# Patient Record
Sex: Male | Born: 1965 | Race: Black or African American | Hispanic: No | Marital: Single | Smoking: Never smoker
Health system: Southern US, Community
[De-identification: ages and names within clinical notes are randomized; demographics above are authoritative.]

## PROBLEM LIST (undated history)

## (undated) DIAGNOSIS — I10 Essential (primary) hypertension: Secondary | ICD-10-CM

## (undated) DIAGNOSIS — G4730 Sleep apnea, unspecified: Secondary | ICD-10-CM

## (undated) HISTORY — PX: ADENOIDECTOMY: SUR15

## (undated) HISTORY — PX: EYE SURGERY: SHX253

## (undated) HISTORY — DX: Morbid (severe) obesity due to excess calories: E66.01

---

## 1998-10-17 ENCOUNTER — Emergency Department (HOSPITAL_COMMUNITY): Admission: EM | Admit: 1998-10-17 | Discharge: 1998-10-17 | Payer: Self-pay | Admitting: Emergency Medicine

## 1998-10-17 ENCOUNTER — Encounter: Payer: Self-pay | Admitting: Emergency Medicine

## 1998-11-05 ENCOUNTER — Emergency Department (HOSPITAL_COMMUNITY): Admission: EM | Admit: 1998-11-05 | Discharge: 1998-11-05 | Payer: Self-pay | Admitting: Emergency Medicine

## 1999-10-20 ENCOUNTER — Encounter (INDEPENDENT_AMBULATORY_CARE_PROVIDER_SITE_OTHER): Payer: Self-pay

## 1999-10-20 ENCOUNTER — Other Ambulatory Visit: Admission: RE | Admit: 1999-10-20 | Discharge: 1999-10-20 | Payer: Self-pay | Admitting: Otolaryngology

## 2002-10-05 ENCOUNTER — Emergency Department (HOSPITAL_COMMUNITY): Admission: EM | Admit: 2002-10-05 | Discharge: 2002-10-06 | Payer: Self-pay | Admitting: Emergency Medicine

## 2005-12-15 ENCOUNTER — Ambulatory Visit: Payer: Self-pay | Admitting: Family Medicine

## 2006-06-09 ENCOUNTER — Ambulatory Visit: Payer: Self-pay | Admitting: Family Medicine

## 2006-06-16 ENCOUNTER — Encounter: Payer: Self-pay | Admitting: Family Medicine

## 2006-06-16 LAB — CONVERTED CEMR LAB
Cholesterol: 190 mg/dL (ref 0–200)
HDL: 38 mg/dL — ABNORMAL LOW (ref >39)
LDL Cholesterol: 131 mg/dL — ABNORMAL HIGH (ref 0–99)
Total CHOL/HDL Ratio: 5
Triglycerides: 105 mg/dL (ref <150)
VLDL: 21 mg/dL (ref 0–40)

## 2006-09-09 ENCOUNTER — Ambulatory Visit: Payer: Self-pay | Admitting: Family Medicine

## 2006-12-23 ENCOUNTER — Ambulatory Visit: Payer: Self-pay | Admitting: Family Medicine

## 2006-12-28 ENCOUNTER — Encounter: Payer: Self-pay | Admitting: Family Medicine

## 2006-12-28 LAB — CONVERTED CEMR LAB
BUN: 11 mg/dL (ref 6–23)
CO2: 22 meq/L (ref 19–32)
Calcium: 9.6 mg/dL (ref 8.4–10.5)
Chloride: 106 meq/L (ref 96–112)
Cholesterol: 176 mg/dL (ref 0–200)
Creatinine, Ser: 0.98 mg/dL (ref 0.40–1.50)
Glucose, Bld: 89 mg/dL (ref 70–99)
HDL: 45 mg/dL (ref >39)
LDL Cholesterol: 112 mg/dL — ABNORMAL HIGH (ref 0–99)
Potassium: 4.8 meq/L (ref 3.5–5.3)
Sodium: 143 meq/L (ref 135–145)
Total CHOL/HDL Ratio: 3.9
Triglycerides: 93 mg/dL (ref <150)
VLDL: 19 mg/dL (ref 0–40)

## 2007-02-09 ENCOUNTER — Encounter: Payer: Self-pay | Admitting: Family Medicine

## 2007-02-21 ENCOUNTER — Emergency Department (HOSPITAL_COMMUNITY): Admission: EM | Admit: 2007-02-21 | Discharge: 2007-02-22 | Payer: Self-pay | Admitting: Emergency Medicine

## 2007-05-05 ENCOUNTER — Ambulatory Visit: Payer: Self-pay | Admitting: Family Medicine

## 2007-05-12 DIAGNOSIS — M79609 Pain in unspecified limb: Secondary | ICD-10-CM

## 2007-05-12 DIAGNOSIS — E785 Hyperlipidemia, unspecified: Secondary | ICD-10-CM

## 2007-05-12 DIAGNOSIS — E669 Obesity, unspecified: Secondary | ICD-10-CM | POA: Insufficient documentation

## 2007-05-22 ENCOUNTER — Ambulatory Visit: Payer: Self-pay | Admitting: Family Medicine

## 2009-01-08 ENCOUNTER — Emergency Department (HOSPITAL_COMMUNITY): Admission: EM | Admit: 2009-01-08 | Discharge: 2009-01-08 | Payer: Self-pay | Admitting: Family Medicine

## 2010-03-10 NOTE — Letter (Signed)
Summary: rpc chart  rpc chart   Imported By: Curtis Sites 08/01/2009 08:15:57  _____________________________________________________________________  External Attachment:    Type:   Image     Comment:   External Document

## 2010-08-05 ENCOUNTER — Ambulatory Visit: Payer: 59 | Attending: Family Medicine | Admitting: Physical Therapy

## 2010-08-05 DIAGNOSIS — IMO0001 Reserved for inherently not codable concepts without codable children: Secondary | ICD-10-CM | POA: Insufficient documentation

## 2010-08-05 DIAGNOSIS — R262 Difficulty in walking, not elsewhere classified: Secondary | ICD-10-CM | POA: Insufficient documentation

## 2010-08-10 ENCOUNTER — Ambulatory Visit: Payer: 59 | Attending: Family Medicine | Admitting: Physical Therapy

## 2010-08-10 DIAGNOSIS — IMO0001 Reserved for inherently not codable concepts without codable children: Secondary | ICD-10-CM | POA: Insufficient documentation

## 2010-08-10 DIAGNOSIS — R262 Difficulty in walking, not elsewhere classified: Secondary | ICD-10-CM | POA: Insufficient documentation

## 2010-08-24 ENCOUNTER — Encounter: Payer: 59 | Admitting: Physical Therapy

## 2010-08-26 ENCOUNTER — Encounter: Payer: 59 | Admitting: Physical Therapy

## 2010-09-01 ENCOUNTER — Encounter: Payer: 59 | Admitting: Physical Therapy

## 2010-09-03 ENCOUNTER — Encounter: Payer: 59 | Admitting: Physical Therapy

## 2010-11-04 ENCOUNTER — Ambulatory Visit (INDEPENDENT_AMBULATORY_CARE_PROVIDER_SITE_OTHER): Payer: 59

## 2010-11-04 ENCOUNTER — Inpatient Hospital Stay (INDEPENDENT_AMBULATORY_CARE_PROVIDER_SITE_OTHER)
Admission: RE | Admit: 2010-11-04 | Discharge: 2010-11-04 | Disposition: A | Discharge status: Home / Self Care | Payer: 59 | Source: Ambulatory Visit | Attending: Emergency Medicine | Admitting: Emergency Medicine

## 2010-11-04 DIAGNOSIS — S61209A Unspecified open wound of unspecified finger without damage to nail, initial encounter: Secondary | ICD-10-CM

## 2011-02-17 ENCOUNTER — Ambulatory Visit (INDEPENDENT_AMBULATORY_CARE_PROVIDER_SITE_OTHER): Payer: 59 | Admitting: General Surgery

## 2011-02-19 ENCOUNTER — Encounter (INDEPENDENT_AMBULATORY_CARE_PROVIDER_SITE_OTHER): Payer: Self-pay | Admitting: General Surgery

## 2011-02-19 ENCOUNTER — Ambulatory Visit (INDEPENDENT_AMBULATORY_CARE_PROVIDER_SITE_OTHER): Payer: 59 | Admitting: General Surgery

## 2011-02-19 NOTE — Progress Notes (Signed)
Patient ID: Jay Gonzalez, male   DOB: 02-16-1965, 46 y.o.   MRN: [id number]  Chief Complaint  Patient presents with  . Other    Lap Band initial    [name] is a 46 y.o. male.   HPI In this patient presents for initial weight loss surgery evaluation. Has a BMI of 45 with obstructive sleep apnea and variable blood pressure. He has attended our information seminar and he is interested in the LAP-BAND. He knows several people who have had lab and and he feels that this will be the best procedure for him to give him "a jump start" with his weight loss. He has been researching also the sleeve gastrectomy but feels that the band will be the safest alternative for him. He has tried several diets including Atkins diet and Weight Watchers but has always regained the weight. He states that he is interested in the weight loss but does not want to lose weight to fast. His goal rate would be around 200-220 pounds. He is riding his bicycle and doing exercises well to try to compensate. He denies any reflux symptoms.  No past medical history on file. PMH: Sleep apnea Variable BP  Past Surgical History  Procedure Date  . Adenoidectomy   . Eye surgery 1971, 1981   Eye surgery Adenoids  No family history on file.  Social History History  Substance Use Topics  . Smoking status: Not on file  . Smokeless tobacco: Not on file  . Alcohol Use: Not on file  Denies smoking Drinks 3-4 drinks/week  Not on File  Current Outpatient Prescriptions  Medication Sig Dispense Refill  . Aspirin-Caffeine (BC FAST PAIN RELIEF PO) Take by mouth.        Review of Systems Review of Systems All other review of systems negative or noncontributory except as stated in the HPI  Blood pressure 142/88, pulse 68, temperature 97.5 F (36.4 C), temperature source Temporal, resp. rate 16, height 5\' 10"  (1.778 m), weight 321 lb (145.605 kg). BMI 45.9 Wt 321lbs  Physical Exam Physical Exam  Vitals  reviewed. Constitutional: He is oriented to person, place, and time. He appears well-developed and well-nourished. No distress.  HENT:  Head: Normocephalic and atraumatic.  Mouth/Throat: No oropharyngeal exudate.  Eyes: Conjunctivae are normal. Pupils are equal, round, and reactive to light. Right eye exhibits no discharge. Left eye exhibits no discharge. No scleral icterus.  Neck: Normal range of motion. No tracheal deviation present.  Cardiovascular: Normal rate, regular rhythm, normal heart sounds and intact distal pulses.   Pulmonary/Chest: Effort normal and breath sounds normal. No stridor. No respiratory distress. He has no wheezes. He has no rales. He exhibits no tenderness.  Abdominal: Soft. Bowel sounds are normal. He exhibits no distension and no mass. There is no tenderness. There is no rebound and no guarding.  Musculoskeletal: Normal range of motion. He exhibits no edema.  Neurological: He is alert and oriented to person, place, and time.  Skin: Skin is warm and dry. No rash noted. He is not diaphoretic. No erythema. No pallor.  Psychiatric: He has a normal mood and affect. His behavior is normal. Judgment and thought content normal.    Data Reviewed   Assessment    Morbid obesity with a BMI of 45.9 and comorbidities of obstructive sleep apnea and hypertension. I think that he be a fine candidate for any of the 3 weight loss procedures and we discussed the risks and benefits of each procedure.  He is most interested in the LAP-BAND but is also considering the sleeve gastrectomy. I think that he would be a fine candidate for the LAP-BAND if this is what he continues to desire. We did emphasize the necessary lifestyle changes that would be required for any of the procedures. The risks of the procedure and the alternatives of continued medical weight loss. We discussed the risks of malnutrition, vitamin deficiencies, 2 little or too much weight loss, weight regain, need for revisional  surgery, and the need for continued and ongoing followup, slippage, and erosion and express understanding and desires to proceed with Lap band evaluation.    Plan    We will get him started on the weight loss surgery workup including nutrition labs, upper GI, psychology evaluation and nutrition evaluation. After this was complete he should be ready for his procedure.       Lodema Pilot DAVID 02/19/2011, 9:15 AM

## 2011-02-20 LAB — CBC WITH DIFFERENTIAL/PLATELET
Basophils Absolute: 0 10*3/uL (ref 0.0–0.1)
Basophils Relative: 0 % (ref 0–1)
Eosinophils Absolute: 0.2 10*3/uL (ref 0.0–0.7)
Eosinophils Relative: 3 % (ref 0–5)
HCT: 45.1 % (ref 39.0–52.0)
Hemoglobin: 15.1 g/dL (ref 13.0–17.0)
Lymphocytes Relative: 25 % (ref 12–46)
Lymphs Abs: 1.7 10*3/uL (ref 0.7–4.0)
MCH: 29.5 pg (ref 26.0–34.0)
MCHC: 33.5 g/dL (ref 30.0–36.0)
MCV: 88.1 fL (ref 78.0–100.0)
Monocytes Absolute: 0.7 10*3/uL (ref 0.1–1.0)
Monocytes Relative: 10 % (ref 3–12)
Neutro Abs: 4.2 10*3/uL (ref 1.7–7.7)
Neutrophils Relative %: 61 % (ref 43–77)
Platelets: 219 10*3/uL (ref 150–400)
RBC: 5.12 MIL/uL (ref 4.22–5.81)
RDW: 14.6 % (ref 11.5–15.5)
WBC: 7 10*3/uL (ref 4.0–10.5)

## 2011-02-20 LAB — COMPREHENSIVE METABOLIC PANEL
ALT: 19 U/L (ref 0–53)
AST: 16 U/L (ref 0–37)
Calcium: 9.5 mg/dL (ref 8.4–10.5)
Chloride: 106 mEq/L (ref 96–112)
Creat: 0.85 mg/dL (ref 0.50–1.35)
Total Bilirubin: 0.9 mg/dL (ref 0.3–1.2)

## 2011-02-20 LAB — LIPID PANEL
HDL: 38 mg/dL — ABNORMAL LOW (ref >39)
LDL Cholesterol: 104 mg/dL — ABNORMAL HIGH (ref 0–99)
Total CHOL/HDL Ratio: 4.3 Ratio
Triglycerides: 100 mg/dL (ref <150)
VLDL: 20 mg/dL (ref 0–40)

## 2011-02-24 ENCOUNTER — Ambulatory Visit: Payer: 59 | Admitting: *Deleted

## 2011-03-01 ENCOUNTER — Ambulatory Visit (HOSPITAL_COMMUNITY)
Admission: RE | Admit: 2011-03-01 | Discharge: 2011-03-01 | Disposition: A | Discharge status: Home / Self Care | Payer: 59 | Source: Ambulatory Visit | Attending: General Surgery | Admitting: General Surgery

## 2011-03-01 ENCOUNTER — Other Ambulatory Visit: Payer: Self-pay

## 2011-03-01 DIAGNOSIS — Z01818 Encounter for other preprocedural examination: Secondary | ICD-10-CM | POA: Insufficient documentation

## 2011-03-01 DIAGNOSIS — Z01810 Encounter for preprocedural cardiovascular examination: Secondary | ICD-10-CM | POA: Insufficient documentation

## 2011-03-04 ENCOUNTER — Other Ambulatory Visit (INDEPENDENT_AMBULATORY_CARE_PROVIDER_SITE_OTHER): Payer: Self-pay | Admitting: General Surgery

## 2011-03-17 ENCOUNTER — Ambulatory Visit: Payer: 59 | Admitting: *Deleted

## 2011-04-05 ENCOUNTER — Ambulatory Visit: Payer: 59 | Admitting: *Deleted

## 2011-04-07 ENCOUNTER — Ambulatory Visit: Payer: 59 | Admitting: *Deleted

## 2011-04-12 ENCOUNTER — Encounter: Payer: Self-pay | Admitting: *Deleted

## 2011-05-20 ENCOUNTER — Encounter: Payer: Self-pay | Admitting: *Deleted

## 2011-05-20 ENCOUNTER — Encounter: Payer: 59 | Attending: General Surgery | Admitting: *Deleted

## 2011-05-20 VITALS — Ht 70.0 in | Wt 310.0 lb

## 2011-05-20 DIAGNOSIS — E669 Obesity, unspecified: Secondary | ICD-10-CM

## 2011-05-20 DIAGNOSIS — Z01818 Encounter for other preprocedural examination: Secondary | ICD-10-CM | POA: Insufficient documentation

## 2011-05-20 DIAGNOSIS — Z713 Dietary counseling and surveillance: Secondary | ICD-10-CM | POA: Insufficient documentation

## 2011-05-20 NOTE — Patient Instructions (Signed)
   Follow Pre-Op Nutrition Goals to prepare for Lapband Surgery.   Call the [facility] at [phone] once you have been given your surgery date to enrolled in the Pre-Op Nutrition Class. You will need to attend this nutrition class 3-4 weeks prior to your surgery. 

## 2011-05-20 NOTE — Progress Notes (Signed)
  Pre-Op Assessment Visit:  Pre-Operative LAGB Surgery  Medical Nutrition Therapy:  Appt start time: 1200 end time:  1300.  Patient was seen on 05/20/2011 for Pre-Operative LAGB Nutrition Assessment. Assessment and letter of approval faxed to [facility] Surgery Bariatric Surgery Program coordinator on 05/21/11.  Approval letter sent to [facility] center and will be available in the chart under the media tab.  TANITA  BODY COMP RESULTS  05/20/11     %Fat 42.1%     FM (lbs) 130.5     FFM (lbs) 179.5     TBW (lbs) 131.5      Handouts given during visit include:  Pre-Op Goals   Bariatric Support Group Calendar  B.E.L.T. Program Flyer  Patient to call for Pre-Op and Post-Op Nutrition Education at the [facility] when surgery is scheduled.

## 2011-06-23 ENCOUNTER — Other Ambulatory Visit (INDEPENDENT_AMBULATORY_CARE_PROVIDER_SITE_OTHER): Payer: Self-pay | Admitting: General Surgery

## 2011-06-23 DIAGNOSIS — E669 Obesity, unspecified: Secondary | ICD-10-CM

## 2011-08-19 ENCOUNTER — Ambulatory Visit (INDEPENDENT_AMBULATORY_CARE_PROVIDER_SITE_OTHER): Payer: 59 | Admitting: General Surgery

## 2011-09-09 ENCOUNTER — Encounter (INDEPENDENT_AMBULATORY_CARE_PROVIDER_SITE_OTHER): Payer: 59 | Admitting: General Surgery

## 2011-09-23 ENCOUNTER — Encounter: Payer: Self-pay | Admitting: *Deleted

## 2011-09-23 ENCOUNTER — Encounter: Payer: 59 | Attending: General Surgery | Admitting: *Deleted

## 2011-09-23 VITALS — Ht 70.0 in | Wt 306.0 lb

## 2011-09-23 DIAGNOSIS — Z01818 Encounter for other preprocedural examination: Secondary | ICD-10-CM | POA: Insufficient documentation

## 2011-09-23 DIAGNOSIS — Z713 Dietary counseling and surveillance: Secondary | ICD-10-CM | POA: Insufficient documentation

## 2011-09-23 DIAGNOSIS — E669 Obesity, unspecified: Secondary | ICD-10-CM | POA: Insufficient documentation

## 2011-09-23 NOTE — Progress Notes (Signed)
Bariatric Class:  Appt start time: 0830 end time:  0930.  Pre-Operative Nutrition Class  Patient was seen on 09/23/2011 for Pre-Operative Bariatric Surgery Education at the [facility].   Surgery date: 10/19/11 Surgery type: LAGB Start weight at [facility]: 310.0 lbs  Weight today: 306.0 lbs Weight change: 4.0 lbs Total weight lost: 4.0 lbs BMI: 43.9 kg/m^2  TANITA  BODY COMP RESULTS  05/20/11 09/23/11   %Fat 42.1% 40.9%   Fat Mass (lbs) 130.5 125.0   Fat Free Mass (lbs) 179.5 181.0   Total Body Water (lbs) 131.5 132.5   Samples given per MNT protocol: Bariatric Advantage Multivitamin Lot # [id number] Exp:12/13  Celebrate Vitamins Multivitamin Lot # [id number] Exp: 07/14  Celebrate Vitamins Multivitamin Complete Lot # [id number] Exp: 11/14  Celebrate Vitamins Calcium Citrate Lot # [id number] Exp: 10/14  Celebrate Vitamins Sublingual B-12 Lot # [id number] Exp: 05/15  Corliss Marcus Protein Powder Lot # [id number] Exp: 12/14  The following the learning objective met by the patient during this course:  Identifies Pre-Op Dietary Goals and will begin 2 weeks pre-operatively  Identifies appropriate sources of fluids and proteins   States protein recommendations and appropriate sources pre and post-operatively  Identifies Post-Operative Dietary Goals and will follow for 2 weeks post-operatively  Identifies appropriate multivitamin and calcium sources  Describes the need for physical activity post-operatively and will follow MD recommendations  States when to call healthcare provider regarding medication questions or post-operative complications  Handouts given during class include:  Pre-Op Bariatric Surgery Diet Handout  Protein Shake Handout  Post-Op Bariatric Surgery Nutrition Handout  BELT Program Information Flyer  Support Group Information Flyer  Follow-Up Plan: Patient will follow-up at [facility] 2 weeks post operatively for diet advancement per MD.

## 2011-09-23 NOTE — Patient Instructions (Signed)
Follow:   Pre-Op Diet per MD 2 weeks prior to surgery  Phase 2- Liquids (clear/full) 2 weeks after surgery  Vitamin/Mineral/Calcium guidelines for purchasing bariatric supplements  Exercise guidelines pre and post-op per MD  Follow-up at [facility] in 2 weeks post-op for diet advancement. Contact Salah Burlison as needed with questions/concerns. 

## 2011-10-05 ENCOUNTER — Encounter (HOSPITAL_COMMUNITY): Payer: Self-pay | Admitting: Pharmacy Technician

## 2011-10-05 NOTE — Progress Notes (Signed)
10-05-11 1045 am -Dr. Biagio Quint -pt. Needs MD order entry in EPIC for PST visit 09-11-11 1415PM [facility]. W. Kennon Portela

## 2011-10-07 ENCOUNTER — Telehealth (INDEPENDENT_AMBULATORY_CARE_PROVIDER_SITE_OTHER): Payer: Self-pay

## 2011-10-07 NOTE — Telephone Encounter (Signed)
Epic Orders needed

## 2011-10-07 NOTE — Telephone Encounter (Signed)
Jay Gonzalez called needing pre admit orders put in on this patient. His appointment is 8/30 @ 1:00. I told her He will be in office in the morning and would send him a message.

## 2011-10-08 ENCOUNTER — Encounter (INDEPENDENT_AMBULATORY_CARE_PROVIDER_SITE_OTHER): Payer: Self-pay | Admitting: General Surgery

## 2011-10-08 ENCOUNTER — Encounter (HOSPITAL_COMMUNITY)
Admission: RE | Admit: 2011-10-08 | Discharge: 2011-10-08 | Disposition: A | Discharge status: Home / Self Care | Payer: 59 | Source: Ambulatory Visit | Attending: General Surgery | Admitting: General Surgery

## 2011-10-08 ENCOUNTER — Ambulatory Visit (INDEPENDENT_AMBULATORY_CARE_PROVIDER_SITE_OTHER): Payer: 59 | Admitting: General Surgery

## 2011-10-08 ENCOUNTER — Encounter (HOSPITAL_COMMUNITY): Payer: Self-pay

## 2011-10-08 LAB — CBC WITH DIFFERENTIAL/PLATELET
Basophils Absolute: 0 10*3/uL (ref 0.0–0.1)
Basophils Relative: 1 % (ref 0–1)
Eosinophils Absolute: 0.2 10*3/uL (ref 0.0–0.7)
Eosinophils Relative: 3 % (ref 0–5)
HCT: 44.3 % (ref 39.0–52.0)
Hemoglobin: 15.7 g/dL (ref 13.0–17.0)
MCH: 29.5 pg (ref 26.0–34.0)
MCHC: 35.4 g/dL (ref 30.0–36.0)
Monocytes Absolute: 0.6 10*3/uL (ref 0.1–1.0)
Monocytes Relative: 8 % (ref 3–12)
Neutro Abs: 4.7 10*3/uL (ref 1.7–7.7)
RDW: 13.6 % (ref 11.5–15.5)

## 2011-10-08 LAB — SURGICAL PCR SCREEN
MRSA, PCR: NEGATIVE
Staphylococcus aureus: POSITIVE — AB

## 2011-10-08 LAB — COMPREHENSIVE METABOLIC PANEL
AST: 21 U/L (ref 0–37)
Albumin: 3.9 g/dL (ref 3.5–5.2)
BUN: 13 mg/dL (ref 6–23)
Calcium: 9.4 mg/dL (ref 8.4–10.5)
Chloride: 103 mEq/L (ref 96–112)
Creatinine, Ser: 0.93 mg/dL (ref 0.50–1.35)
Total Bilirubin: 0.7 mg/dL (ref 0.3–1.2)
Total Protein: 7.4 g/dL (ref 6.0–8.3)

## 2011-10-08 NOTE — Progress Notes (Signed)
Patient ID: COAST FRANCIA, male   DOB: 1965-05-07, 46 y.o.   MRN: [id number]  Chief Complaint  Patient presents with  . Bariatric Pre-op    [name] is a 46 y.o. male.  This patient comes in today for his preoperative evaluation for lap band surgery on September 10 2 he has already started his preoperative diet and has lost 67 pounds already. He has a BMI of 43 with sleep apnea and is using his CPAP machine. He is exercising appropriately biking twice per week and plans on increasing this to 4-5 times per week if possible. He has already completed his nutrition and psychology evaluation and laboratory studies. HPI  Past Medical History  Diagnosis Date  . Morbid obesity     Past Surgical History  Procedure Date  . Adenoidectomy   . Eye surgery 1971, 16    Family History  Problem Relation Age of Onset  . Cancer Paternal Aunt   . Cancer Paternal Aunt   . Cancer Paternal Aunt     Social History History  Substance Use Topics  . Smoking status: Never Smoker   . Smokeless tobacco: Not on file  . Alcohol Use: Yes     maybe 2 beers per week or less    No Known Allergies  No current outpatient prescriptions on file.    Review of Systems Review of Systems All other review of systems negative or noncontributory except as stated in the HPI  Blood pressure 144/72, pulse 70, temperature 97.8 F (36.6 C), temperature source Temporal, resp. rate 16, height 5\' 10"  (1.778 m), weight 303 lb (137.44 kg).  Physical Exam Physical Exam Physical Exam  Vitals reviewed. Constitutional: He is oriented to person, place, and time. He appears well-developed and well-nourished. No distress.  HENT:  Head: Normocephalic and atraumatic.  Mouth/Throat: No oropharyngeal exudate.  Eyes: Conjunctivae and EOM are normal. Pupils are equal, round, and reactive to light. Right eye exhibits no discharge. Left eye exhibits no discharge. No scleral icterus.  Neck: Normal range of motion. No  tracheal deviation present.  Cardiovascular: Normal rate, regular rhythm and normal heart sounds.   Pulmonary/Chest: Effort normal and breath sounds normal. No stridor. No respiratory distress. He has no wheezes. He has no rales. He exhibits no tenderness.  Abdominal: Soft. Bowel sounds are normal. He exhibits no distension and no mass. There is no tenderness. There is no rebound and no guarding.  Musculoskeletal: Normal range of motion. He exhibits no edema and no tenderness.  Neurological: He is alert and oriented to person, place, and time.  Skin: Skin is warm and dry. No rash noted. He is not diaphoretic. No erythema. No pallor.  Psychiatric: He has a normal mood and affect. His behavior is normal. Judgment and thought content normal.    Data Reviewed   Assessment    Morbid obesity with a BMI of 43 and comorbidities of obstructive sleep apnea. He appears to be ready and excited about his upcoming LAP-BAND surgery. He is scheduled for September 10. We again discussed the procedure as well as its benefits and alternatives. We discussed the risks of the procedure The risks of infection, bleeding, pain, scarring, weight regain, too little or too much weight loss, vitamin deficiencies and need for lifelong vitamin supplementation, hair loss, need for protein supplementation, food intolerance, need for reoperation, need for open surgery, band slippage, port malfunction, esophageal dilation and swallowing problems, injury to spleen or surrounding structures, DVT's, PE, and death again  discussed with the patient and the patient expressed understanding and desires to proceed with laparoscopic adjustable gastric band.     Plan    Will plan for lap band placement next week.       Lodema Pilot DAVID 10/08/2011, 1:52 PM

## 2011-10-08 NOTE — Patient Instructions (Addendum)
20 GALO SAYED  10/08/2011   Your procedure is scheduled on:  Tuesday 10/19/2011 at 1035 am  Report to [facility] at 0800 AM.  Call this number if you have problems the morning of surgery: [phone]   Remember:   Do not eat food:After Midnight.  May have clear liquids:until Midnight .    Take these medicines the morning of surgery with A SIP OF WATER: NONE   Do not wear jewelry  Do not wear lotions, powders, or perfumes.  Do not shave 48 hours prior to surgery. Men may shave face and neck.  Do not bring valuables to the hospital.  Contacts, dentures or bridgework may not be worn into surgery.  Leave suitcase in the car. After surgery it may be brought to your room.  For patients admitted to the hospital, checkout time is 11:00 AM the day of discharge.       Special Instructions: CHG Shower Use Special Wash: 1/2 bottle night before surgery and 1/2 bottle morning of surgery.   Please read over the following fact sheets that you were given: MRSA Information, Sleep Apnea sheet, Incentive Spirometry sheet                 Make sure if you are going home the same day as your surgery that you have sometone here to drive you home and someone to stay with you overnight after surgery.                If you have any questions, please call me at [phone] [facility]. Georgeanna Lea, RN,BSN

## 2011-10-08 NOTE — Progress Notes (Signed)
Office note from 04/26/2011 from Dr. Theressa Millard, and Sleep apnea study from [facility] services dated 03/25/2010, and 04/15/2010 all on chart.

## 2011-10-19 ENCOUNTER — Encounter (HOSPITAL_COMMUNITY)
Admission: RE | Disposition: A | Discharge status: Home / Self Care | Payer: Self-pay | Source: Ambulatory Visit | Attending: General Surgery

## 2011-10-19 ENCOUNTER — Encounter (HOSPITAL_COMMUNITY): Payer: Self-pay | Admitting: Certified Registered Nurse Anesthetist

## 2011-10-19 ENCOUNTER — Ambulatory Visit (HOSPITAL_COMMUNITY): Payer: 59 | Admitting: Certified Registered Nurse Anesthetist

## 2011-10-19 ENCOUNTER — Encounter (HOSPITAL_COMMUNITY): Payer: Self-pay | Admitting: *Deleted

## 2011-10-19 ENCOUNTER — Ambulatory Visit (HOSPITAL_COMMUNITY)
Admission: RE | Admit: 2011-10-19 | Discharge: 2011-10-20 | Disposition: A | Discharge status: Home / Self Care | Payer: 59 | Source: Ambulatory Visit | Attending: General Surgery | Admitting: General Surgery

## 2011-10-19 DIAGNOSIS — G4733 Obstructive sleep apnea (adult) (pediatric): Secondary | ICD-10-CM

## 2011-10-19 DIAGNOSIS — Z01812 Encounter for preprocedural laboratory examination: Secondary | ICD-10-CM | POA: Insufficient documentation

## 2011-10-19 DIAGNOSIS — Z6841 Body Mass Index (BMI) 40.0 and over, adult: Secondary | ICD-10-CM

## 2011-10-19 HISTORY — PX: LAPAROSCOPIC GASTRIC BANDING: SHX1100

## 2011-10-19 SURGERY — GASTRIC BANDING, LAPAROSCOPIC
Anesthesia: General | Site: Abdomen | Wound class: Clean

## 2011-10-19 MED ORDER — ENOXAPARIN SODIUM 40 MG/0.4ML ~~LOC~~ SOLN
40.0000 mg | SUBCUTANEOUS | Status: DC
  Administered 2011-10-20: 40 mg via SUBCUTANEOUS
  Filled 2011-10-19 (×2): qty 0.4

## 2011-10-19 MED ORDER — KCL IN DEXTROSE-NACL 20-5-0.45 MEQ/L-%-% IV SOLN
INTRAVENOUS | Status: AC
  Administered 2011-10-19: 15:00:00
  Filled 2011-10-19: qty 1000

## 2011-10-19 MED ORDER — 0.9 % SODIUM CHLORIDE (POUR BTL) OPTIME
TOPICAL | Status: DC | PRN
  Administered 2011-10-19: 1000 mL

## 2011-10-19 MED ORDER — FENTANYL CITRATE 0.05 MG/ML IJ SOLN
INTRAMUSCULAR | Status: DC | PRN
  Administered 2011-10-19 (×5): 50 ug via INTRAVENOUS

## 2011-10-19 MED ORDER — LACTATED RINGERS IV SOLN
INTRAVENOUS | Status: DC
  Administered 2011-10-19: 1000 mL via INTRAVENOUS

## 2011-10-19 MED ORDER — DEXAMETHASONE SODIUM PHOSPHATE 10 MG/ML IJ SOLN
INTRAMUSCULAR | Status: DC | PRN
  Administered 2011-10-19: 10 mg via INTRAVENOUS

## 2011-10-19 MED ORDER — ROCURONIUM BROMIDE 100 MG/10ML IV SOLN
INTRAVENOUS | Status: DC | PRN
  Administered 2011-10-19 (×2): 10 mg via INTRAVENOUS
  Administered 2011-10-19: 40 mg via INTRAVENOUS

## 2011-10-19 MED ORDER — CEFAZOLIN SODIUM-DEXTROSE 2-3 GM-% IV SOLR
INTRAVENOUS | Status: AC
  Filled 2011-10-19: qty 50

## 2011-10-19 MED ORDER — BUPIVACAINE-EPINEPHRINE 0.25% -1:200000 IJ SOLN
INTRAMUSCULAR | Status: DC | PRN
  Administered 2011-10-19: 15 mL

## 2011-10-19 MED ORDER — PROPOFOL 10 MG/ML IV BOLUS
INTRAVENOUS | Status: DC | PRN
  Administered 2011-10-19: 200 mg via INTRAVENOUS

## 2011-10-19 MED ORDER — ONDANSETRON HCL 4 MG/2ML IJ SOLN
INTRAMUSCULAR | Status: DC | PRN
  Administered 2011-10-19: 4 mg via INTRAVENOUS

## 2011-10-19 MED ORDER — KCL IN DEXTROSE-NACL 20-5-0.45 MEQ/L-%-% IV SOLN
INTRAVENOUS | Status: DC
  Administered 2011-10-19 – 2011-10-20 (×3): via INTRAVENOUS
  Filled 2011-10-19 (×5): qty 1000

## 2011-10-19 MED ORDER — ACETAMINOPHEN 160 MG/5ML PO SOLN: 650.0000 mg | ORAL | Status: DC | PRN

## 2011-10-19 MED ORDER — MORPHINE SULFATE 2 MG/ML IJ SOLN
2.0000 mg | INTRAMUSCULAR | Status: DC | PRN
  Administered 2011-10-19 (×4): 2 mg via INTRAVENOUS
  Administered 2011-10-20: 4 mg via INTRAVENOUS
  Administered 2011-10-20: 2 mg via INTRAVENOUS
  Filled 2011-10-19 (×2): qty 1
  Filled 2011-10-19: qty 2
  Filled 2011-10-19 (×3): qty 1

## 2011-10-19 MED ORDER — ACETAMINOPHEN 10 MG/ML IV SOLN
INTRAVENOUS | Status: DC | PRN
  Administered 2011-10-19: 1000 mg via INTRAVENOUS

## 2011-10-19 MED ORDER — ONDANSETRON HCL 4 MG/2ML IJ SOLN
4.0000 mg | INTRAMUSCULAR | Status: DC | PRN
  Administered 2011-10-19 (×2): 4 mg via INTRAVENOUS
  Filled 2011-10-19 (×2): qty 2

## 2011-10-19 MED ORDER — ENOXAPARIN SODIUM 40 MG/0.4ML ~~LOC~~ SOLN
40.0000 mg | Freq: Once | SUBCUTANEOUS | Status: AC
  Administered 2011-10-19: 40 mg via SUBCUTANEOUS
  Filled 2011-10-19: qty 0.4

## 2011-10-19 MED ORDER — SUCCINYLCHOLINE CHLORIDE 20 MG/ML IJ SOLN
INTRAMUSCULAR | Status: DC | PRN
  Administered 2011-10-19: 100 mg via INTRAVENOUS

## 2011-10-19 MED ORDER — LIDOCAINE-EPINEPHRINE 1 %-1:100000 IJ SOLN
INTRAMUSCULAR | Status: AC
  Filled 2011-10-19: qty 1

## 2011-10-19 MED ORDER — ACETAMINOPHEN 10 MG/ML IV SOLN
INTRAVENOUS | Status: AC
  Filled 2011-10-19: qty 100

## 2011-10-19 MED ORDER — LACTATED RINGERS IV SOLN: INTRAVENOUS | Status: DC

## 2011-10-19 MED ORDER — BUPIVACAINE HCL (PF) 0.25 % IJ SOLN
INTRAMUSCULAR | Status: AC
  Filled 2011-10-19: qty 30

## 2011-10-19 MED ORDER — NEOSTIGMINE METHYLSULFATE 1 MG/ML IJ SOLN
INTRAMUSCULAR | Status: DC | PRN
  Administered 2011-10-19: 5 mg via INTRAVENOUS

## 2011-10-19 MED ORDER — MEPERIDINE HCL 50 MG/ML IJ SOLN: 6.2500 mg | INTRAMUSCULAR | Status: DC | PRN

## 2011-10-19 MED ORDER — LIDOCAINE-EPINEPHRINE 1 %-1:100000 IJ SOLN
INTRAMUSCULAR | Status: DC | PRN
  Administered 2011-10-19: 15 mL

## 2011-10-19 MED ORDER — HYDROMORPHONE HCL PF 1 MG/ML IJ SOLN: 0.2500 mg | INTRAMUSCULAR | Status: DC | PRN

## 2011-10-19 MED ORDER — MIDAZOLAM HCL 5 MG/5ML IJ SOLN
INTRAMUSCULAR | Status: DC | PRN
  Administered 2011-10-19: 2 mg via INTRAVENOUS

## 2011-10-19 MED ORDER — DEXTROSE 5 % IV SOLN
3.0000 g | INTRAVENOUS | Status: AC
  Administered 2011-10-19: 3 g via INTRAVENOUS
  Filled 2011-10-19: qty 3000

## 2011-10-19 MED ORDER — OXYCODONE-ACETAMINOPHEN 5-325 MG/5ML PO SOLN
5.0000 mL | ORAL | Status: DC | PRN
  Administered 2011-10-20: 10 mL via ORAL
  Filled 2011-10-19: qty 10

## 2011-10-19 MED ORDER — PROMETHAZINE HCL 25 MG/ML IJ SOLN: 6.2500 mg | INTRAMUSCULAR | Status: DC | PRN

## 2011-10-19 MED ORDER — SODIUM CHLORIDE 0.9 % IJ SOLN
INTRAMUSCULAR | Status: DC | PRN
  Administered 2011-10-19: 20 mL

## 2011-10-19 MED ORDER — CEFAZOLIN SODIUM 1-5 GM-% IV SOLN
INTRAVENOUS | Status: AC
  Filled 2011-10-19: qty 50

## 2011-10-19 MED ORDER — LIDOCAINE HCL (CARDIAC) 20 MG/ML IV SOLN
INTRAVENOUS | Status: DC | PRN
  Administered 2011-10-19: 100 mg via INTRAVENOUS

## 2011-10-19 MED ORDER — GLYCOPYRROLATE 0.2 MG/ML IJ SOLN
INTRAMUSCULAR | Status: DC | PRN
  Administered 2011-10-19: 0.6 mg via INTRAVENOUS

## 2011-10-19 SURGICAL SUPPLY — 60 items
ADH SKN CLS APL DERMABOND .7 (GAUZE/BANDAGES/DRESSINGS)
APL SKNCLS STERI-STRIP NONHPOA (GAUZE/BANDAGES/DRESSINGS)
BAND LAP VG SYSTEM W/TUBES (Band) IMPLANT
BENZOIN TINCTURE PRP APPL 2/3 (GAUZE/BANDAGES/DRESSINGS) IMPLANT
BLADE HEX COATED 2.75 (ELECTRODE) ×3 IMPLANT
BLADE SURG 15 STRL LF DISP TIS (BLADE) ×2 IMPLANT
BLADE SURG 15 STRL SS (BLADE) ×3
BLADE SURG SZ11 CARB STEEL (BLADE) ×3 IMPLANT
CABLE HIGH FREQUENCY MONO STRZ (ELECTRODE) ×3 IMPLANT
CANISTER SUCTION 2500CC (MISCELLANEOUS) ×3 IMPLANT
CHLORAPREP W/TINT 26ML (MISCELLANEOUS) ×6 IMPLANT
CLOTH BEACON ORANGE TIMEOUT ST (SAFETY) ×3 IMPLANT
COVER SURGICAL LIGHT HANDLE (MISCELLANEOUS) ×3 IMPLANT
DECANTER SPIKE VIAL GLASS SM (MISCELLANEOUS) ×6 IMPLANT
DERMABOND ADVANCED (GAUZE/BANDAGES/DRESSINGS)
DERMABOND ADVANCED .7 DNX12 (GAUZE/BANDAGES/DRESSINGS) IMPLANT
DEVICE SUTURE ENDOST 10MM (ENDOMECHANICALS) ×3 IMPLANT
DRAPE CAMERA CLOSED 9X96 (DRAPES) ×3 IMPLANT
DRAPE WARM FLUID 44X44 (DRAPE) ×2 IMPLANT
ELECT REM PT RETURN 9FT ADLT (ELECTROSURGICAL) ×3
ELECTRODE REM PT RTRN 9FT ADLT (ELECTROSURGICAL) ×2 IMPLANT
ENDOSTITCH 0 SINGLE 48 (SUTURE) IMPLANT
GLOVE BIOGEL PI IND STRL 7.0 (GLOVE) ×2 IMPLANT
GLOVE BIOGEL PI INDICATOR 7.0 (GLOVE) ×1
GLOVE SURG SS PI 7.5 STRL IVOR (GLOVE) ×6 IMPLANT
GOWN STRL NON-REIN LRG LVL3 (GOWN DISPOSABLE) ×6 IMPLANT
GOWN STRL REIN XL XLG (GOWN DISPOSABLE) ×7 IMPLANT
HOVERMATT SINGLE USE (MISCELLANEOUS) ×3 IMPLANT
KIT BASIN OR (CUSTOM PROCEDURE TRAY) ×3 IMPLANT
LAP BAND VG SYSTEM W/TUBES (Band) ×3 IMPLANT
MESH HERNIA 1X4 RECT BARD (Mesh General) IMPLANT
MESH HERNIA BARD 1X4 (Mesh General) ×1 IMPLANT
NDL SPNL 22GX3.5 QUINCKE BK (NEEDLE) ×2 IMPLANT
NEEDLE HYPO 22GX1.5 SAFETY (NEEDLE) ×3 IMPLANT
NEEDLE SPNL 22GX3.5 QUINCKE BK (NEEDLE) ×3 IMPLANT
NS IRRIG 1000ML POUR BTL (IV SOLUTION) ×3 IMPLANT
PACK UNIVERSAL I (CUSTOM PROCEDURE TRAY) ×3 IMPLANT
PENCIL BUTTON HOLSTER BLD 10FT (ELECTRODE) ×3 IMPLANT
RELOAD ENDO STITCH (ENDOMECHANICALS) ×9 IMPLANT
RELOAD SUT TRIPLE-STITCH 2-0 (ENDOMECHANICALS) IMPLANT
SCISSORS LAP 5X35 DISP (ENDOMECHANICALS) ×3 IMPLANT
SET IRRIG TUBING LAPAROSCOPIC (IRRIGATION / IRRIGATOR) IMPLANT
SLEEVE ENDOPATH XCEL 5M (ENDOMECHANICALS) ×6 IMPLANT
SLEEVE XCEL OPT CAN 5 100 (ENDOMECHANICALS) ×3 IMPLANT
SOLUTION ANTI FOG 6CC (MISCELLANEOUS) ×3 IMPLANT
SPONGE GAUZE 4X4 12PLY (GAUZE/BANDAGES/DRESSINGS) ×3 IMPLANT
SPONGE LAP 18X18 X RAY DECT (DISPOSABLE) ×3 IMPLANT
STAPLER VISISTAT 35W (STAPLE) ×3 IMPLANT
STRIP CLOSURE SKIN 1/2X4 (GAUZE/BANDAGES/DRESSINGS) IMPLANT
SUT MNCRL AB 4-0 PS2 18 (SUTURE) ×6 IMPLANT
SUT PROLENE 2 0 CT2 30 (SUTURE) ×1 IMPLANT
SUT SILK 0 (SUTURE) ×3
SUT SILK 0 30XBRD TIE 6 (SUTURE) IMPLANT
SYR 20CC LL (SYRINGE) ×3 IMPLANT
SYR CONTROL 10ML LL (SYRINGE) ×3 IMPLANT
TOWEL OR 17X26 10 PK STRL BLUE (TOWEL DISPOSABLE) ×8 IMPLANT
TROCAR BLADELESS 15MM (ENDOMECHANICALS) ×3 IMPLANT
TROCAR BLADELESS OPT 5 100 (ENDOMECHANICALS) ×3 IMPLANT
TUBE CALIBRATION LAPBAND (TUBING) ×3 IMPLANT
TUBING INSUFFLATION 10FT LAP (TUBING) ×3 IMPLANT

## 2011-10-19 NOTE — Anesthesia Postprocedure Evaluation (Signed)
  Anesthesia Post-op Note  Patient: Jay Gonzalez  Procedure(s) Performed: Procedure(s) (LRB): LAPAROSCOPIC GASTRIC BANDING (N/A) MESH APPLIED TO LAP PORT ()  Patient Location: PACU  Anesthesia Type: General  Level of Consciousness: awake and alert   Airway and Oxygen Therapy: Patient Spontanous Breathing  Post-op Pain: mild  Post-op Assessment: Post-op Vital signs reviewed, Patient's Cardiovascular Status Stable, Respiratory Function Stable, Patent Airway and No signs of Nausea or vomiting  Post-op Vital Signs: stable  Complications: No apparent anesthesia complications

## 2011-10-19 NOTE — H&P (View-Only) (Signed)
Patient ID: Jay Gonzalez, male   DOB: 03/06/1965, 46 y.o.   MRN: [id number]  Chief Complaint  Patient presents with  . Bariatric Pre-op    [name] is a 46 y.o. male.  This patient comes in today for his preoperative evaluation for lap band surgery on September 10 2 he has already started his preoperative diet and has lost 67 pounds already. He has a BMI of 43 with sleep apnea and is using his CPAP machine. He is exercising appropriately biking twice per week and plans on increasing this to 4-5 times per week if possible. He has already completed his nutrition and psychology evaluation and laboratory studies. HPI  Past Medical History  Diagnosis Date  . Morbid obesity     Past Surgical History  Procedure Date  . Adenoidectomy   . Eye surgery 1971, 1981    Family History  Problem Relation Age of Onset  . Cancer Paternal Aunt   . Cancer Paternal Aunt   . Cancer Paternal Aunt     Social History History  Substance Use Topics  . Smoking status: Never Smoker   . Smokeless tobacco: Not on file  . Alcohol Use: Yes     maybe 2 beers per week or less    No Known Allergies  No current outpatient prescriptions on file.    Review of Systems Review of Systems All other review of systems negative or noncontributory except as stated in the HPI  Blood pressure 144/72, pulse 70, temperature 97.8 F (36.6 C), temperature source Temporal, resp. rate 16, height 5' 10" (1.778 m), weight 303 lb (137.44 kg).  Physical Exam Physical Exam Physical Exam  Vitals reviewed. Constitutional: He is oriented to person, place, and time. He appears well-developed and well-nourished. No distress.  HENT:  Head: Normocephalic and atraumatic.  Mouth/Throat: No oropharyngeal exudate.  Eyes: Conjunctivae and EOM are normal. Pupils are equal, round, and reactive to light. Right eye exhibits no discharge. Left eye exhibits no discharge. No scleral icterus.  Neck: Normal range of motion. No  tracheal deviation present.  Cardiovascular: Normal rate, regular rhythm and normal heart sounds.   Pulmonary/Chest: Effort normal and breath sounds normal. No stridor. No respiratory distress. He has no wheezes. He has no rales. He exhibits no tenderness.  Abdominal: Soft. Bowel sounds are normal. He exhibits no distension and no mass. There is no tenderness. There is no rebound and no guarding.  Musculoskeletal: Normal range of motion. He exhibits no edema and no tenderness.  Neurological: He is alert and oriented to person, place, and time.  Skin: Skin is warm and dry. No rash noted. He is not diaphoretic. No erythema. No pallor.  Psychiatric: He has a normal mood and affect. His behavior is normal. Judgment and thought content normal.    Data Reviewed   Assessment    Morbid obesity with a BMI of 43 and comorbidities of obstructive sleep apnea. He appears to be ready and excited about his upcoming LAP-BAND surgery. He is scheduled for September 10. We again discussed the procedure as well as its benefits and alternatives. We discussed the risks of the procedure The risks of infection, bleeding, pain, scarring, weight regain, too little or too much weight loss, vitamin deficiencies and need for lifelong vitamin supplementation, hair loss, need for protein supplementation, food intolerance, need for reoperation, need for open surgery, band slippage, port malfunction, esophageal dilation and swallowing problems, injury to spleen or surrounding structures, DVT's, PE, and death again   discussed with the patient and the patient expressed understanding and desires to proceed with laparoscopic adjustable gastric band.     Plan    Will plan for lap band placement next week.       Jay Gonzalez DAVID 10/08/2011, 1:52 PM    

## 2011-10-19 NOTE — Brief Op Note (Signed)
10/19/2011  1:29 PM  PATIENT:  Jay Gonzalez  46 y.o. male  PRE-OPERATIVE DIAGNOSIS:  morbid obesity   POST-OPERATIVE DIAGNOSIS:  morbid obesity  PROCEDURE:  Procedure(s) (LRB) with comments: LAPAROSCOPIC GASTRIC BANDING (N/A) MESH APPLIED TO LAP PORT ()  SURGEON:  Surgeon(s) and Role:    * Lodema Pilot, DO - Primary    * Kandis Cocking, MD - Assisting  PHYSICIAN ASSISTANT:   ASSISTANTS: Newman   ANESTHESIA:   general  EBL:  Total I/O In: 1000 [I.V.:1000] Out: 125 [Urine:125]  BLOOD ADMINISTERED:none  DRAINS: none   LOCAL MEDICATIONS USED:  MARCAINE    and LIDOCAINE   SPECIMEN:  No Specimen  DISPOSITION OF SPECIMEN:  N/A  COUNTS:  YES  TOURNIQUET:  * No tourniquets in log *  DICTATION: .Other Dictation: Dictation Number   PLAN OF CARE: Admit for overnight observation  PATIENT DISPOSITION:  PACU - hemodynamically stable.   Delay start of Pharmacological VTE agent (>24hrs) due to surgical blood loss or risk of bleeding: no

## 2011-10-19 NOTE — Anesthesia Preprocedure Evaluation (Addendum)
Anesthesia Evaluation  Patient identified by MRN, date of birth, ID band Patient awake    Reviewed: Allergy & Precautions, H&P , NPO status , Patient's Chart, lab work & pertinent test results  Airway Mallampati: II TM Distance: >3 FB Neck ROM: Full    Dental No notable dental hx.    Pulmonary neg pulmonary ROS,  breath sounds clear to auscultation  Pulmonary exam normal       Cardiovascular hypertension, negative cardio ROS  Rhythm:Regular Rate:Normal     Neuro/Psych negative neurological ROS  negative psych ROS   GI/Hepatic negative GI ROS, Neg liver ROS,   Endo/Other  negative endocrine ROSMorbid obesity  Renal/GU negative Renal ROS  negative genitourinary   Musculoskeletal negative musculoskeletal ROS (+)   Abdominal (+) + obese,   Peds negative pediatric ROS (+)  Hematology negative hematology ROS (+)   Anesthesia Other Findings   Reproductive/Obstetrics negative OB ROS                           Anesthesia Physical Anesthesia Plan  ASA: III  Anesthesia Plan: General   Post-op Pain Management:    Induction: Intravenous  Airway Management Planned: Oral ETT  Additional Equipment:   Intra-op Plan:   Post-operative Plan: Extubation in OR  Informed Consent: I have reviewed the patients History and Physical, chart, labs and discussed the procedure including the risks, benefits and alternatives for the proposed anesthesia with the patient or authorized representative who has indicated his/her understanding and acceptance.   Dental advisory given  Plan Discussed with: CRNA  Anesthesia Plan Comments:         Anesthesia Quick Evaluation

## 2011-10-19 NOTE — Interval H&P Note (Signed)
History and Physical Interval Note:  10/19/2011 11:42 AM  Jay Gonzalez  has presented today for surgery, with the diagnosis of morbid obesity   The various methods of treatment have been discussed with the patient and family. After consideration of risks, benefits and other options for treatment, the patient has consented to  Procedure(s) (LRB) with comments: LAPAROSCOPIC GASTRIC BANDING (N/A) as a surgical intervention .  The patient's history has been reviewed, patient examined, no change in status, stable for surgery.  I have reviewed the patient's chart and labs.  Questions were answered to the patient's satisfaction.  Pt. Seen in the preop area.  Risks of the procedure again discussed with the patient.  The risks of infection, bleeding, pain, scarring, weight regain, too little or too much weight loss, vitamin deficiencies and need for lifelong vitamin supplementation, hair loss, need for protein supplementation, food intolerance, need for reoperation, need for open surgery, band slippage, esophageal dilation and motility problems, erosion, need for band removal, frequent vomiting, injury to spleen or surrounding structures, DVT's, PE, and death again discussed with the patient and the patient expressed understanding and desires to proceed with laparoscopic adjustable gastric banding.    Lodema Pilot DAVID

## 2011-10-19 NOTE — Transfer of Care (Signed)
Immediate Anesthesia Transfer of Care Note  Patient: Jay Gonzalez  Procedure(s) Performed: Procedure(s) (LRB): LAPAROSCOPIC GASTRIC BANDING (N/A) MESH APPLIED TO LAP PORT ()  Patient Location: PACU  Anesthesia Type: General  Level of Consciousness: sedated, patient cooperative and responds to stimulaton  Airway & Oxygen Therapy: Patient Spontanous Breathing and Patient connected to face mask oxgen  Post-op Assessment: Report given to PACU RN and Post -op Vital signs reviewed and stable  Post vital signs: Reviewed and stable  Complications: No apparent anesthesia complications

## 2011-10-20 ENCOUNTER — Ambulatory Visit (HOSPITAL_COMMUNITY): Payer: 59

## 2011-10-20 ENCOUNTER — Encounter (HOSPITAL_COMMUNITY): Payer: Self-pay | Admitting: General Surgery

## 2011-10-20 LAB — CBC WITH DIFFERENTIAL/PLATELET
Basophils Relative: 0 % (ref 0–1)
Eosinophils Absolute: 0 10*3/uL (ref 0.0–0.7)
HCT: 43.1 % (ref 39.0–52.0)
Hemoglobin: 15 g/dL (ref 13.0–17.0)
Lymphs Abs: 1.4 10*3/uL (ref 0.7–4.0)
MCH: 28.8 pg (ref 26.0–34.0)
MCHC: 34.8 g/dL (ref 30.0–36.0)
Monocytes Absolute: 0.8 10*3/uL (ref 0.1–1.0)
Monocytes Relative: 6 % (ref 3–12)
Neutro Abs: 11.9 10*3/uL — ABNORMAL HIGH (ref 1.7–7.7)
Neutrophils Relative %: 84 % — ABNORMAL HIGH (ref 43–77)
RBC: 5.2 MIL/uL (ref 4.22–5.81)

## 2011-10-20 LAB — COMPREHENSIVE METABOLIC PANEL
Alkaline Phosphatase: 81 U/L (ref 39–117)
BUN: 8 mg/dL (ref 6–23)
Chloride: 99 mEq/L (ref 96–112)
Creatinine, Ser: 0.9 mg/dL (ref 0.50–1.35)
GFR calc Af Amer: >90 mL/min (ref >90)
Glucose, Bld: 126 mg/dL — ABNORMAL HIGH (ref 70–99)
Potassium: 4.1 mEq/L (ref 3.5–5.1)
Total Bilirubin: 0.8 mg/dL (ref 0.3–1.2)

## 2011-10-20 MED ORDER — IOHEXOL 300 MG/ML  SOLN: 20.0000 mL | Freq: Once | INTRAMUSCULAR | Status: DC | PRN

## 2011-10-20 MED ORDER — OXYCODONE-ACETAMINOPHEN 5-325 MG/5ML PO SOLN: 5.0000 mL | ORAL | Status: DC | PRN

## 2011-10-20 NOTE — Discharge Summary (Signed)
  Physician Discharge Summary  Patient ID: PACER DORN MRN: [id number] DOB/AGE: Feb 12, 1965 46 y.o.  Admit date: 10/19/2011 Discharge date: 10/20/2011  Admission Diagnoses:morbid obesity  Discharge Diagnoses: same Active Problems:  * No active hospital problems. *    Discharged Condition: good  Hospital Course: to OR for lap band 10/19/11.  No apparent complications. Did well overnight and pain controlled on oral medications.  UGI with brisk passage of contrast.  Stable and ready for discharge on POD 1  Consults: None  Significant Diagnostic Studies: radiology: UGI 10/20/11  Treatments: surgery: lap band with APL 10/19/11  Disposition: Final discharge disposition not confirmed  Discharge Orders    Future Appointments: Provider: Department: Dept Phone: Center:   11/02/2011 4:00 PM [facility] [phone] [facility]   11/05/2011 8:30 AM Lodema Pilot, DO [facility] [phone] None     Future Orders Please Complete By Expires   Increase activity slowly      Discharge instructions      Comments:   Call [phone] to schedule follow up appointment with DR. Jazalyn Mondor in 4 weeks. May shower tomorrow.   Clear liquid diet until Friday, then milky liquids x 1week, then pureed diet x 1 week, then soft diet x1 week, then advance to normal low fat, high protein diet. May take multivitamin and protein supplements   Call MD for:  temperature >100.4      Call MD for:  persistant nausea and vomiting      Call MD for:  severe uncontrolled pain      Call MD for:  redness, tenderness, or signs of infection (pain, swelling, redness, odor or green/yellow discharge around incision site)          Medication List     As of 10/20/2011  3:50 PM    TAKE these medications         oxyCODONE-acetaminophen 5-325 MG/5ML solution   Commonly known as: ROXICET   Take 5-10 mLs by mouth every 4 (four) hours as needed.         SignedLodema Pilot DAVID 10/20/2011, 3:50  PM

## 2011-10-20 NOTE — Progress Notes (Signed)
UGI results called to Dr. Biagio Quint; advance to POD #1 diet; Michel Harrow, RN notified and pt aware. Talmadge Chad, RN Bariatric Nurse coordinator

## 2011-10-20 NOTE — Progress Notes (Signed)
Pt alert and oriented; wife at bedside; VSS; c/o mild nausea when coughing but denies vomiting; tolerating water well; denies flatus or BM; voiding without difficulty; ambulating without difficulty; c/o some "gas pain" with relief from prn meds; using incentive spirometer; awaiting UGI; pt already has follow up appts with [facility] and [facility]; discharge instructions reviewed and pt and wife verbalized understanding of; questions answered. ADJUSTABLE GASTRIC BAND DISCHARGE INSTRUCTIONS  Drs. Fredrik Rigger, [place], Wilson, and [place] Call if you have any problems.   Call [phone] and ask for the surgeon on call.    If you need immediate assistance come to the ER at [facility]. Tell the ER personnel that you are a new post-op gastric banding patient. Signs and symptoms to report:   Severe vomiting or nausea. If you cannot tolerate clear liquids for longer than 1 day, you need to call your surgeon.    Abdominal pain which does not get better after taking your pain medication   Fever greater than 101 F degree   Difficulty breathing   Chest pain    Redness, swelling, drainage, or foul odor at incision sites    If your incisions open or pull apart   Swelling or pain in calf (lower leg)   Diarrhea, frequent watery, uncontrolled bowel movements.   Constipation, (no bowel movements for 3 days) if this occurs, Take Milk of Magnesia, 2 tablespoons by mouth, 3 times a day for 2 days if needed.  Call your doctor if constipation continues. Stop taking Milk of Magnesia once you have had a bowel movement. You may also use Miralax according to the label instructions.   Anything you consider "abnormal for you".   Normal side effects after Surgery:   Unable to sleep at night or concentrate   Irritability   Being tearful (crying) or depressed   These are common complaints, possibly related to your anesthesia, stress of surgery and change in lifestyle, that usually go away a few weeks after surgery.  If these  feelings continue, call your medical doctor.  Wound Care You may have surgical glue, steri-strips, or staples over your incisions after surgery.  Surgical glue:  Looks like a clear film over your incisions and will wear off gradually. Steri-strips: Strips of tape over your incisions. You may notice a yellowish color on the skin underneath the steri-strips. This is a substance used to make the steri-strips stick better. Do not pull the steri-strips off - let them fall off. Staples: Cherlynn Polo may be removed before you leave the hospital. If you go home with staples, call [facility] [phone]) for an appointment with your surgeon's nurse to have staples removed in 7 - 10 days. Showering: You may shower two days after your surgery unless otherwise instructed by your surgeon. Wash gently around wounds with warm soapy water, rinse well, and gently pat dry.  If you have a drain, you may need someone to hold this while you shower. Avoid tub baths until staples are removed and incisions are healed.    Medications   Medications should be liquid or crushed if larger than the size of a dime.  Extended release pills should not be crushed.   Depending on the size and number of medications you take, you may need to stagger/change the time you take your medications so that you do not over-fill your pouch.    Make sure you follow-up with your primary care physician to make medication adjustments needed during rapid weight loss and life-style adjustment.  If you are diabetic, follow up with the doctor that prescribes your diabetes medication(s) within one week after surgery and check your blood sugar regularly.   Do not drive while taking narcotics!   Do not take acetaminophen (Tylenol) and Roxicet or Lortab Elixir at the same time since these pain medications contain acetaminophen.  Diet at home: (First 2 Weeks)  You will see the nutritionist two weeks after your surgery. She will advance your  diet if you are tolerating liquids well. Once at home, if you have severe vomiting or nausea and cannot tolerate clear liquids lasting longer than 1 day, call your surgeon.  For Same Day Surgery Discharge Patients: The day of surgery drink water only: 2 ounces every 4 hours. If you are tolerating water, begin drinking your high protein shake the next morning. For Overnight Stay Patients: Begin high protein shake 2 ounces every 3 hours, 5 - 6 times per day.  Gradually increase the amount you drink as tolerated.  You may find it easier to slowly sip shakes throughout the day.  It is important to get your proteins in first.   Protein Shake   Drink at least 2 ounces of shake 5-6 times per day   Each serving of protein shakes should have a minimum of 15 grams of protein and no more than 5 grams of carbohydrate    Increase the amount of protein shake you drink as tolerated   Protein powder may be added to fluids such as non-fat milk or Lactaid milk (limit to 20 grams added protein powder per serving   The initial goal is to drink at least 8 ounces of protein shake/drink per day (or as directed by the nutritionist). Some examples of protein shakes are [employer], [employer], EAS Edge HP, and Unjury. Hydration   Gradually increase the amount of water and other liquids as tolerated (See Acceptable Fluids)   Gradually increase the amount of protein shake as tolerated     Sip fluids slowly and throughout the day   May use Sugar substitutes, use sparingly (limit to 6 - 8 packets per day).  Your fluid goal is 64 ounces of fluid daily. It may take a few weeks to build up to this.         32 oz (or more) should be clear liquids and 32 oz (or more) should be full liquids.         Liquids should not contain sugar, caffeine, or carbonation!  Acceptable Fluids Clear Liquids:   Water or Sugar-free flavored water, Fruit H2O   Decaffeinated coffee or tea (sugar-free)   Crystal Lite, Wyler's Lite, Minute  Maid Lite   Sugar-free Jell-O   Bouillon or broth   Sugar-free Popsicle:   *Less than 20 calories each; Limit 1 per day   Full Liquids:              Protein Shakes/Drinks + 2 choices per day of other full liquids shown below.    Other full liquids must be: No more than 12 grams of Carbs per serving,  No more than 3 grams of Fat per serving   Strained low-fat cream soup   Non-Fat milk   Fat-free Lactaid Milk   Sugar-free yogurt (Dannon Lite & Fit) Vitamins and Minerals (Start 1 day after surgery unless otherwise directed)   1 Chewable Multivitamin / Multimineral Supplement (i.e. Centrum for Adults)   Chewable Calcium Citrate with Vitamin D-3. Take 1500 mg each day.           (  Example: 3 Chewable Calcium Plus 600 with Vitamin D-3 can be found at [facility])           Do not mix multivitamins containing iron with calcium supplements; take 2 hours   apart   Do not substitute Tums (calcium carbonate) for your calcium   Menstruating women and those at risk for anemia may need extra iron. Talk with your doctor to see if you need additional iron.     If you need extra iron:  Total daily Iron recommendations (including Vitamins) = 50 - 100 mg Iron/day Do not stop taking or change any vitamins or minerals until you talk to your nutritionist or surgeon. Your nutritionist and / or physician must approve all vitamin and mineral supplements. Exercise For maximum success, begin exercising as soon as your doctor recommends. Make sure your physician approves any physical activity.   Depending on fitness level, begin with a simple walking program   Walk 5-15 minutes each day, 7 days per week.    Slowly increase until you are walking 30-45 minutes per day   Consider joining our BELT program. [phone] or email belt@[employer] .edu Things to remember:   You may have sexual relations when you feel comfortable. It is VERY important for male patients to use a reliable birth control method. Fertility often increases  after surgery. Do not get pregnant for at least 18 months.   It is very important to keep all follow up appointments with your surgeon, nutritionist, primary care physician, and behavioral health practitioner. After the first year, please follow up with your bariatric surgeon at least once a year in order to maintain best weight loss results.  [facility]: [phone] [facility]: [phone]   Free counseling is available for you and your family through collaboration between [facility] and [place]. Please call [phone] and leave a message.    Consider purchasing a medical alert bracelet that says you had lap-band surgery.    [facility] has a free Bariatric Surgery Support Group that meets monthly, the 3rd Thursday, 6 pm, Classroom #1, [place]. You may register online at [email or web address], but registration is not necessary. Select Classes and Support Groups, Bariatric Surgery, or Call [phone]   Do not return to work or drive until cleared by your surgeon   Use your CPAP when sleeping if applicable   Do not lift anything greater than ten pounds for at least two weeks.   You will probably have your first fill (fluid added to your band) 6 weeks after surgery  Talmadge Chad, RN Bariatric Nurse Coordinator

## 2011-10-20 NOTE — Op Note (Signed)
NAMEKALIM, KISSEL               ACCOUNT NO.:  [id number]  MEDICAL RECORD NO.:  [id number]  LOCATION:  1538                         FACILITY:  [facility]  PHYSICIAN:  Lodema Pilot, MD       DATE OF BIRTH:  Dec 16, 1965  DATE OF PROCEDURE:  10/19/2011 DATE OF DISCHARGE:                              OPERATIVE REPORT   PROCEDURE:  Laparoscopic adjustable gastric band placement.  PREOPERATIVE DIAGNOSIS:  Morbid obesity and sleep apnea.  POSTOPERATIVE DIAGNOSIS:  Morbid obesity and sleep apnea.  SURGEON:  Lodema Pilot, MD  ASSISTANT:  Dr. Ezzard Standing.  ANESTHESIA:  General endotracheal anesthesia with 30 mL of 1% lidocaine with epinephrine and 0.25% Marcaine in a 50:50 mixture.  FLUIDS:  1600 mL of crystalloid.  ESTIMATED BLOOD LOSS:  Minimal.  DRAINS:  None.  SPECIMENS:  None.  COMPLICATIONS:  None apparent.  FINDINGS:  No evidence of hiatal hernia.  Placement of AP large lap band was approximately 3-3.5 mL of fluid left in the band.  The port was buried subcutaneously just to the patient's right rectus incision.  INDICATION FOR PROCEDURE:  Mr. Lobos is a 46 year old male with BMI of 43 and comorbidities of sleep apnea, was failed medical weight loss management and requesting durable weight loss solution.  OPERATIVE DETAILS:  Ms. Bagdasarian was seen evaluated in the preoperative area and risks and benefits of procedure were discussed in lay terms. Informed consent was obtained.  Prophylactic antibiotics were given and subcu Lovenox was administered as well.  He was taken to the operating room, placed on table in supine position, and general endotracheal anesthesia was obtained.  Foley catheter was placed and his abdomen was prepped and draped in a standard surgical fashion.  Procedure time-out was performed with all operative team members to confirm proper patient and procedure.  Then, a 5-mm Optiview trocar was used to access the peritoneum in the left upper quadrant and  pneumoperitoneum was obtained. Laparoscope was introduced into the abdomen.  There was no evidence of bowel injury upon entry.  A 5-mm left rectus port was placed, a 15-mm right rectus port was placed at the base of the falciform ligament, and a 5-mm right lateral abdominal trocar was also placed under direct visualization.  The [facility] liver retractor was placed in the abdomen and epigastrium through a separate stab incision and the left lobe of the liver was retracted.  The epigastric fat pad and the peritoneum was divided at the angle of his using Bovie electrocautery and the pars flaccida was entered and was then divided with Bovie electrocautery and crossing fat pad was identified and a small tunnel was made retrogastric without difficulty.  I passed my instruments posterior to the esophagus and stomach and exited it out posterior to the stomach at the angle of his.  Prior to casting instrument in a retrogastric tunnel, a calibration tubing was passed by the CRNA into the oropharynx and in the stomach and 10 mL of air was used to inflate the balloon.  The balloon was pulled back, and there was no evidence of any hiatal hernia at the esophageal hiatus.  The calibration tuning was removed, I passed an AP large band into  the abdomen and used my previously placed grasper to pull the tubing through the retrogastric tunnel, and seat this posteriorly to the stomach.  The band was locked into place, and the fundoplication was created taking bites of the gastric fundus high on the stomach and suturing this to the stomach just at the base of the gastric fat pads with stomach bites using a 2-0 Surgidac suture.  Three fundoplication stitches were used to secure the band in place.  A Patterson anti-slip stitch was placed as well.  There was no evidence of bleeding from the retrogastric tunnel or by the spleen and the band tubing was pulled out through the left rectus 5 mm port sites.  Then, the  fascia was approximated at the 15-mm port site with 0 Vicryl sutures.  Hence, I passed the Kelly clamps from the 15-mm port site superficial fascia to the 5 mm optical trocar, and pulled the tubing through the subcutaneous tissue just superficial to the fascia.  The tubing was cut and the port reservoir was locked into place.  We had sutures of Prolene meshed to the port and using a Huber needle, I accessed the port and aspirated the fluid and any excess air bubbles and replaced fluid back into the bands at neutral and then aspirated some fluid from that.  The band was less than neutral.  There was approximately 3-3.5 mL of fluid in the band.  Visually, the band actually appeared to be fairly loose on the stomach as he did not have much upper abdominal or gastric fat pad and they could have possibly passed a AP standard bands, however, following the product insert recommendation, which shows the AP large bands in this patient.  A subcutaneous pocket was created just to the right of the patient's right rectus trocar sites and the port was buried in the subcutaneous tissue and the abdomen was then re-insufflated with carbon dioxide gas and the tubing was pulled back into the abdomen.  The port appeared to be sitting flat.  Then, the incisions were injected with a total of 30 mL of 1% lidocaine with epinephrine and 0.25% Marcaine in a 50:50 mixture. The dermis was approximated over the bands tubing and ports using a 3-0 Vicryl dermal sutures.  The skin edges were then approximated with 4-0 Monocryl subcuticular suture and all skin incisions.  The skin was washed and dried, and Dermabond was applied.  Foley catheter was removed at end of the case.  All sponge and instrument counts were correct at the end of the case.  The patient tolerated procedure well and was stable and ready for transfer to the recovery room in stable condition.          ______________________________ Lodema Pilot,  MD     BL/MEDQ  D:  10/19/2011  T:  10/20/2011  Job:  [id number]

## 2011-10-20 NOTE — Progress Notes (Signed)
1 Day Post-Op  Subjective: Doing well. No complaints.  Objective: Vital signs in last 24 hours: Temp:  [97.7 F (36.5 C)-99.1 F (37.3 C)] 99.1 F (37.3 C) (09/11 0617) Pulse Rate:  [74-104] 94  (09/11 0617) Resp:  [18-24] 18  (09/11 0617) BP: (126-165)/(75-100) 138/86 mmHg (09/11 0617) SpO2:  [92 %-100 %] 96 % (09/11 0617) Weight:  [302 lb (136.986 kg)] 302 lb (136.986 kg) (09/10 1508)    Intake/Output from previous day: 09/10 0701 - 09/11 0700 In: 3726.7 [P.O.:60; I.V.:3666.7] Out: 2025 [Urine:2025] Intake/Output this shift:    General appearance: alert, cooperative and no distress Resp: clear to auscultation bilaterally Cardio: regular rate and rhythm, S1, S2 normal, no murmur, click, rub or gallop GI: soft, minimal tenderness, ND, wounds okay  Lab Results:   [employer] 10/20/11 0417  WBC 14.1*  HGB 15.0  HCT 43.1  PLT 231   BMET  Basename 10/20/11 0417  NA 134*  K 4.1  CL 99  CO2 27  GLUCOSE 126*  BUN 8  CREATININE 0.90  CALCIUM 9.3   PT/INR No results found for this basename: LABPROT:2,INR:2 in the last 72 hours ABG No results found for this basename: PHART:2,PCO2:2,PO2:2,HCO3:2 in the last 72 hours  Studies/Results: No results found.  Anti-infectives: Anti-infectives     Start     Dose/Rate Route Frequency Ordered Stop   10/19/11 0804   ceFAZolin (ANCEF) 3 g in dextrose 5 % 50 mL IVPB        3 g 160 mL/hr over 30 Minutes Intravenous 60 min pre-op 10/19/11 0804 10/19/11 1147          Assessment/Plan: s/p Procedure(s) (LRB) with comments: LAPAROSCOPIC GASTRIC BANDING (N/A) MESH APPLIED TO LAP PORT () UGI this am.  then advance diet and plan for discharge to home later today.  LOS: 1 day    Lodema Pilot DAVID 10/20/2011

## 2011-11-02 ENCOUNTER — Encounter: Payer: 59 | Attending: General Surgery | Admitting: *Deleted

## 2011-11-02 ENCOUNTER — Encounter: Payer: Self-pay | Admitting: *Deleted

## 2011-11-02 VITALS — Ht 70.0 in | Wt 289.0 lb

## 2011-11-02 DIAGNOSIS — Z713 Dietary counseling and surveillance: Secondary | ICD-10-CM | POA: Insufficient documentation

## 2011-11-02 DIAGNOSIS — E669 Obesity, unspecified: Secondary | ICD-10-CM | POA: Insufficient documentation

## 2011-11-02 DIAGNOSIS — Z01818 Encounter for other preprocedural examination: Secondary | ICD-10-CM | POA: Insufficient documentation

## 2011-11-02 NOTE — Patient Instructions (Signed)
Patient to follow Phase 3A-Soft, High Protein Diet and follow-up at [facility] in 6 weeks for 2 months post-op nutrition visit for diet advancement. 

## 2011-11-02 NOTE — Progress Notes (Signed)
Bariatric Class:  Appt start time: 1600 end time:  1700.  2 Week Post-Operative Nutrition Class  Patient was seen on 11/02/2011 for Post-Operative Nutrition [facility].   Surgery date: 10/19/11 Surgery type: LAGB Start weight at [facility]: 310.0 lbs  Weight today: 289.0 lbs Weight change: 17.0 lbs Total weight lost: 21.0 lbs BMI: 41.5 kg/m^2  TANITA  BODY COMP RESULTS  05/20/11 09/23/11 11/02/11   %Fat 42.1% 40.9% 39.1%   Fat Mass (lbs) 130.5 125.0 113.0   Fat Free Mass (lbs) 179.5 181.0 176.0   Total Body Water (lbs) 131.5 132.5 129.0   The following the learning objective met the patient during this course:  Identifies Phase 3A (Soft, High Proteins) Dietary Goals and will begin from 2 weeks post-operatively to 2 months post-operatively  Identifies appropriate sources of fluids and proteins   States protein recommendations and appropriate sources post-operatively  Identifies the need for appropriate texture modifications, mastication, and bite sizes when consuming solids  Identifies appropriate multivitamin and calcium sources post-operatively  Describes the need for physical activity post-operatively and will follow MD recommendations  States when to call healthcare provider regarding medication questions or post-operative complications  Handouts given during class include:  Phase 3A: Soft, High Protein Diet Handout  Band Fill Guidelines Handout  Follow-Up Plan: Patient will follow-up at [facility] in 6 weeks for 2 months post-op nutrition visit for diet advancement per MD.

## 2011-11-05 ENCOUNTER — Ambulatory Visit (INDEPENDENT_AMBULATORY_CARE_PROVIDER_SITE_OTHER): Payer: 59 | Admitting: General Surgery

## 2011-11-05 ENCOUNTER — Encounter (INDEPENDENT_AMBULATORY_CARE_PROVIDER_SITE_OTHER): Payer: Self-pay | Admitting: General Surgery

## 2011-11-05 VITALS — BP 134/80 | HR 72 | Temp 97.3°F | Resp 16 | Ht 70.0 in | Wt 291.2 lb

## 2011-11-05 DIAGNOSIS — Z4889 Encounter for other specified surgical aftercare: Secondary | ICD-10-CM

## 2011-11-05 DIAGNOSIS — Z5189 Encounter for other specified aftercare: Secondary | ICD-10-CM

## 2011-11-05 NOTE — Progress Notes (Signed)
Subjective:     Patient ID: Jay Gonzalez, male   DOB: March 01, 1965, 46 y.o.   MRN: [id number]  HPI No pain and recovering well.  No regurgitation or vomiting or cough or reflux.  Taking protein and vitamins.  +exercising.  Says that he doesn't feel much restriction and is snacking.  Review of Systems     Objective:   Physical Exam Wounds healing well, no infection.  Band aspirated on first attempt.  3.15ml of clear fluid in band.  1.20ml added for a total of 4.21ml     Assessment:     S/p lap band-doing well 1.63ml added to band today.  Seems to be doing well.  No evidence of postop complications.  Good weight loss but doesn't feel much restriction.    Plan:     F/u 4 weeks Post band fill diet

## 2011-12-02 ENCOUNTER — Encounter (INDEPENDENT_AMBULATORY_CARE_PROVIDER_SITE_OTHER): Payer: 59 | Admitting: General Surgery

## 2011-12-15 ENCOUNTER — Ambulatory Visit: Payer: 59 | Admitting: *Deleted

## 2012-05-01 ENCOUNTER — Encounter (HOSPITAL_COMMUNITY): Payer: Self-pay | Admitting: Emergency Medicine

## 2012-05-01 ENCOUNTER — Emergency Department (HOSPITAL_COMMUNITY)
Admission: EM | Admit: 2012-05-01 | Discharge: 2012-05-01 | Disposition: A | Discharge status: Home / Self Care | Payer: 59 | Source: Home / Self Care | Attending: Family Medicine | Admitting: Family Medicine

## 2012-05-01 DIAGNOSIS — J329 Chronic sinusitis, unspecified: Secondary | ICD-10-CM

## 2012-05-01 LAB — POCT RAPID STREP A: Streptococcus, Group A Screen (Direct): NEGATIVE

## 2012-05-01 MED ORDER — AMOXICILLIN 500 MG PO CAPS: 500.0000 mg | ORAL_CAPSULE | Freq: Two times a day (BID) | ORAL | Status: DC

## 2012-05-01 NOTE — Discharge Instructions (Signed)
Drink LOTS of liquids.  Use saline nasal spray several times a day to help your congestion drain.  Finish all of the antibiotics even if you are feeling better.    Sinusitis Sinusitis is redness, soreness, and swelling (inflammation) of the paranasal sinuses. Paranasal sinuses are air pockets within the bones of your face (beneath the eyes, the middle of the forehead, or above the eyes). In healthy paranasal sinuses, mucus is able to drain out, and air is able to circulate through them by way of your nose. However, when your paranasal sinuses are inflamed, mucus and air can become trapped. This can allow bacteria and other germs to grow and cause infection. Sinusitis can develop quickly and last only a short time (acute) or continue over a long period (chronic). Sinusitis that lasts for more than 12 weeks is considered chronic.  CAUSES  Causes of sinusitis include:  Allergies.  Structural abnormalities, such as displacement of the cartilage that separates your nostrils (deviated septum), which can decrease the air flow through your nose and sinuses and affect sinus drainage.  Functional abnormalities, such as when the small hairs (cilia) that line your sinuses and help remove mucus do not work properly or are not present. SYMPTOMS  Symptoms of acute and chronic sinusitis are the same. The primary symptoms are pain and pressure around the affected sinuses. Other symptoms include:  Upper toothache.  Earache.  Headache.  Bad breath.  Decreased sense of smell and taste.  A cough, which worsens when you are lying flat.  Fatigue.  Fever.  Thick drainage from your nose, which often is green and may contain pus (purulent).  Swelling and warmth over the affected sinuses. DIAGNOSIS  Your caregiver will perform a physical exam. During the exam, your caregiver may:  Look in your nose for signs of abnormal growths in your nostrils (nasal polyps).  Tap over the affected sinus to check for  signs of infection.  View the inside of your sinuses (endoscopy) with a special imaging device with a light attached (endoscope), which is inserted into your sinuses. If your caregiver suspects that you have chronic sinusitis, one or more of the following tests may be recommended:  Allergy tests.  Nasal culture A sample of mucus is taken from your nose and sent to a lab and screened for bacteria.  Nasal cytology A sample of mucus is taken from your nose and examined by your caregiver to determine if your sinusitis is related to an allergy. TREATMENT  Most cases of acute sinusitis are related to a viral infection and will resolve on their own within 10 days. Sometimes medicines are prescribed to help relieve symptoms (pain medicine, decongestants, nasal steroid sprays, or saline sprays).  However, for sinusitis related to a bacterial infection, your caregiver will prescribe antibiotic medicines. These are medicines that will help kill the bacteria causing the infection.  Rarely, sinusitis is caused by a fungal infection. In theses cases, your caregiver will prescribe antifungal medicine. For some cases of chronic sinusitis, surgery is needed. Generally, these are cases in which sinusitis recurs more than 3 times per year, despite other treatments. HOME CARE INSTRUCTIONS   Drink plenty of water. Water helps thin the mucus so your sinuses can drain more easily.  Use a humidifier.  Inhale steam 3 to 4 times a day (for example, sit in the bathroom with the shower running).  Apply a warm, moist washcloth to your face 3 to 4 times a day, or as directed by your  caregiver.  Use saline nasal sprays to help moisten and clean your sinuses.  Take over-the-counter or prescription medicines for pain, discomfort, or fever only as directed by your caregiver. SEEK IMMEDIATE MEDICAL CARE IF:  You have increasing pain or severe headaches.  You have nausea, vomiting, or drowsiness.  You have swelling  around your face.  You have vision problems.  You have a stiff neck.  You have difficulty breathing. MAKE SURE YOU:   Understand these instructions.  Will watch your condition.  Will get help right away if you are not doing well or get worse. Document Released: 01/25/2005 Document Revised: 04/19/2011 Document Reviewed: 02/09/2011 [facility] Patient Information 2013 [place], [place].

## 2012-05-01 NOTE — ED Provider Notes (Signed)
History     CSN: [id number]  Arrival date & time 05/01/12  1615   First MD Initiated Contact with Patient 05/01/12 1749      Chief Complaint  Patient presents with  . URI    (Consider location/radiation/quality/duration/timing/severity/associated sxs/prior treatment) HPI Comments: Pt with a cold he "can't shake".  C/o sinus pain on R side, chest congestion, cough, yellow thick nasal discharge, R teeth hurt.   Patient is a 47 y.o. male presenting with URI. The history is provided by the patient.  URI Presenting symptoms: congestion, cough and fever   Presenting symptoms: no ear pain and no sore throat   Severity:  Moderate Onset quality:  Gradual Duration:  8 days Timing:  Constant Progression:  Worsening Chronicity:  New Relieved by:  Nothing Worsened by:  Nothing tried Ineffective treatments:  OTC medications Associated symptoms: sinus pain   Associated symptoms: no myalgias     Past Medical History  Diagnosis Date  . Morbid obesity     Past Surgical History  Procedure Laterality Date  . Adenoidectomy    . Eye surgery  1971, 1981  . Laparoscopic gastric banding  10/19/2011    Procedure: LAPAROSCOPIC GASTRIC BANDING;  Surgeon: Lodema Pilot, DO;  Location: [facility];  Service: General;  Laterality: N/A;  . Eye surgery      Family History  Problem Relation Age of Onset  . Cancer Paternal Aunt   . Cancer Paternal Aunt   . Cancer Paternal Aunt     History  Substance Use Topics  . Smoking status: Never Smoker   . Smokeless tobacco: Not on file  . Alcohol Use: Yes     Comment: maybe 2 beers per week or less      Review of Systems  Constitutional: Positive for fever and chills.  HENT: Positive for congestion, postnasal drip and sinus pressure. Negative for ear pain and sore throat.   Respiratory: Positive for cough.   Musculoskeletal: Negative for myalgias.    Allergies  Review of patient's allergies indicates no known allergies.  Home Medications    Current Outpatient Rx  Name  Route  Sig  Dispense  Refill  . amoxicillin (AMOXIL) 500 MG capsule   Oral   Take 1 capsule (500 mg total) by mouth 2 (two) times daily.   20 capsule   0     BP 145/86  Pulse 87  Temp(Src) 98.1 F (36.7 C) (Oral)  Resp 16  SpO2 97%  Physical Exam  Constitutional: He appears well-developed and well-nourished. No distress.  Appears ill  HENT:  Right Ear: Tympanic membrane, external ear and ear canal normal.  Left Ear: Tympanic membrane, external ear and ear canal normal.  Nose: Mucosal edema present. Right sinus exhibits maxillary sinus tenderness and frontal sinus tenderness. Left sinus exhibits no maxillary sinus tenderness and no frontal sinus tenderness.  Mouth/Throat: Oropharynx is clear and moist and mucous membranes are normal.  Cardiovascular: Normal rate and regular rhythm.   Pulmonary/Chest: Effort normal and breath sounds normal.  Lymphadenopathy:       Head (right side): No submental, no submandibular and no tonsillar adenopathy present.       Head (left side): No submental, no submandibular and no tonsillar adenopathy present.    He has no cervical adenopathy.    ED Course  Procedures (including critical care time)  Labs Reviewed  POCT RAPID STREP A ([facility] CARE ONLY)   No results found.   1. Sinusitis  MDM  Pt sick for 8 days, sx c/w sinusitis.         Cathlyn Parsons, NP 05/01/12 1755

## 2012-05-01 NOTE — ED Notes (Addendum)
Pt c/o sore throat, runny nose, cough and congestion x 1 week. Feels hot like he has a fever. Feels sinus pressure. Chest pain due to congestion. Cough is non productive now but had small amounts of yellowish-green phelgm at onset of symptoms. Mild nausea. No vomiting or diarrhea. Pt took OTC sinus medication and BC powder with little relief. Patient is alert and oriented with no signs of distress.

## 2012-05-03 NOTE — ED Provider Notes (Signed)
Medical screening examination/treatment/procedure(s) were performed by resident physician or non-physician practitioner and as supervising physician I was immediately available for consultation/collaboration.   Barkley Bruns MD.   Linna Hoff, MD 05/03/12 2046

## 2012-05-04 ENCOUNTER — Encounter (INDEPENDENT_AMBULATORY_CARE_PROVIDER_SITE_OTHER): Payer: 59

## 2013-06-06 ENCOUNTER — Telehealth (HOSPITAL_COMMUNITY): Payer: Self-pay

## 2013-06-06 NOTE — Telephone Encounter (Signed)
This patient is overdue for recommended follow-up with [facility]ric [facility]. Call attempted today to reestablish post-op care with [facility]. Cell signal was poor and there was too much background noise to hear one another. However, was successful in confirming mailing address.  A letter will be mailed to the patient today to the address on file from [facility] advising the patient on the benefits of follow-up care and directing them to call [facility] at [phone] to schedule an appointment at their earliest convenience.   Jim LikeAmanda T. [facility] Coordinator [phone]

## 2014-04-10 ENCOUNTER — Ambulatory Visit: Payer: Self-pay | Admitting: Dietician

## 2014-10-27 ENCOUNTER — Emergency Department (HOSPITAL_COMMUNITY)
Admission: EM | Admit: 2014-10-27 | Discharge: 2014-10-27 | Disposition: A | Discharge status: Home / Self Care | Payer: Commercial Managed Care - HMO | Source: Home / Self Care

## 2014-10-27 ENCOUNTER — Encounter (HOSPITAL_COMMUNITY): Payer: Self-pay

## 2014-10-27 DIAGNOSIS — R059 Cough, unspecified: Secondary | ICD-10-CM

## 2014-10-27 DIAGNOSIS — R05 Cough: Secondary | ICD-10-CM | POA: Diagnosis not present

## 2014-10-27 DIAGNOSIS — J189 Pneumonia, unspecified organism: Secondary | ICD-10-CM | POA: Diagnosis not present

## 2014-10-27 MED ORDER — AZITHROMYCIN 250 MG PO TABS: ORAL_TABLET | ORAL | Status: DC

## 2014-10-27 MED ORDER — GUAIFENESIN-CODEINE 100-10 MG/5ML PO SYRP: 5.0000 mL | ORAL_SOLUTION | Freq: Three times a day (TID) | ORAL | Status: DC | PRN

## 2014-10-27 NOTE — ED Provider Notes (Signed)
CSN: [id number]     Arrival date & time 10/27/14  1319 History   None    Chief Complaint  Patient presents with  . Nasal Congestion   (Consider location/radiation/quality/duration/timing/severity/associated sxs/prior Treatment) Patient is a 49 y.o. male presenting with cough. The history is provided by the patient. No language interpreter was used.  Cough Cough characteristics:  Productive and vomit-inducing (C/O cough x 2 wks now.yellowish brown sputum production) Sputum characteristics:  Yellow and brown (He saw blood twice in his sputum.  last time was 4 days ago.) Severity:  Moderate Onset quality:  Gradual Duration:  2 weeks Timing:  Constant Progression:  Worsening Chronicity:  New Smoker: no   Context: sick contacts and smoke exposure   Context: not animal exposure, not exposure to allergens and not occupational exposure   Context comment:  While he was on the cruise her girl friend was sick. he was also on the cruise.  Relieved by:  Nothing Worsened by:  Nothing tried Ineffective treatments:  Decongestant and cough suppressants (CPAP does not help at night which he uses normally.) Associated symptoms: chest pain, shortness of breath, sinus congestion, sore throat and wheezing   Associated symptoms: no chills, no eye discharge, no fever, no headaches, no rash and no weight loss   Associated symptoms comment:  Chest pain with excessive coughing Risk factors: recent travel   Risk factors comment:  He recently travelled to [place] but he was sick before travelling   Past Medical History  Diagnosis Date  . Morbid obesity    Past Surgical History  Procedure Laterality Date  . Adenoidectomy    . Eye surgery  1971, 1981  . Laparoscopic gastric banding  10/19/2011    Procedure: LAPAROSCOPIC GASTRIC BANDING;  Surgeon: Lodema Pilot, DO;  Location: [facility];  Service: General;  Laterality: N/A;  . Eye surgery     Family History  Problem Relation Age of Onset  . Cancer Paternal  Aunt   . Cancer Paternal Aunt   . Cancer Paternal Aunt    Social History  Substance Use Topics  . Smoking status: Never Smoker   . Smokeless tobacco: None  . Alcohol Use: Yes     Comment: maybe 2 beers per week or less    Review of Systems  Constitutional: Negative for fever, chills and weight loss.  HENT: Positive for sore throat.   Eyes: Negative for discharge.  Respiratory: Positive for cough, shortness of breath and wheezing.   Cardiovascular: Positive for chest pain.  Gastrointestinal: Negative.   Genitourinary: Negative.   Skin: Negative for rash.  Neurological: Negative for headaches.  All other systems reviewed and are negative.   Allergies  Review of patient's allergies indicates no known allergies.  Home Medications   Prior to Admission medications   Medication Sig Start Date End Date Taking? Authorizing Provider  amoxicillin (AMOXIL) 500 MG capsule Take 1 capsule (500 mg total) by mouth 2 (two) times daily. 05/01/12   Cathlyn Parsons, NP   Meds Ordered and Administered this Visit  Medications - No data to display  BP 144/96 mmHg  Pulse 92  Temp(Src) 98.9 F (37.2 C) (Oral)  Resp 16  SpO2 97% No data found.   Physical Exam  Constitutional: He is oriented to person, place, and time. He appears well-developed. No distress.  HENT:  Head: Normocephalic.  Right Ear: External ear normal.  Left Ear: External ear normal.  Mouth/Throat: Oropharynx is clear and moist. No oropharyngeal exudate.  Cardiovascular: Normal  rate, regular rhythm, normal heart sounds and intact distal pulses.   No murmur heard. Pulmonary/Chest: Effort normal and breath sounds normal. No respiratory distress. He has no decreased breath sounds. He has no wheezes. He has no rhonchi. He has no rales. He exhibits no tenderness.  Musculoskeletal: Normal range of motion. He exhibits no edema.  Neurological: He is alert and oriented to person, place, and time.  Nursing note and vitals  reviewed.   ED Course  Procedures (including critical care time)  Labs Review Labs Reviewed - No data to display  Imaging Review No results found.   Visual Acuity Review  Right Eye Distance:   Left Eye Distance:   Bilateral Distance:    Right Eye Near:   Left Eye Near:    Bilateral Near:         MDM  No diagnosis found. Cough X 2 wks. Likely walking pneumonia. Although patient looks clinically stable with normal lung exam. Plan to treat with Z-pack. Cheratussin prn cough recommended. Return precaution discussed. If no improvement with antibiotic will recommend xray of the chest. Patient agreed with plan.    Doreene Eland, MD 10/27/14 [phone]

## 2014-10-27 NOTE — Discharge Instructions (Signed)
Cough, Adult   A cough is a reflex. It helps you clear your throat and airways. A cough can help heal your body. A cough can last 2 or 3 weeks (acute) or may last more than 8 weeks (chronic). Some common causes of a cough can include an infection, allergy, or a cold.  HOME CARE  · Only take medicine as told by your doctor.  · If given, take your medicines (antibiotics) as told. Finish them even if you start to feel better.  · Use a cold steam vaporizer or humidifier in your home. This can help loosen thick spit (secretions).  · Sleep so you are almost sitting up (semi-upright). Use pillows to do this. This helps reduce coughing.  · Rest as needed.  · Stop smoking if you smoke.  GET HELP RIGHT AWAY IF:  · You have yellowish-white fluid (pus) in your thick spit.  · Your cough gets worse.  · Your medicine does not reduce coughing, and you are losing sleep.  · You cough up blood.  · You have trouble breathing.  · Your pain gets worse and medicine does not help.  · You have a fever.  MAKE SURE YOU:   · Understand these instructions.  · Will watch your condition.  · Will get help right away if you are not doing well or get worse.  Document Released: 10/08/2010 Document Revised: 06/11/2013 Document Reviewed: 10/08/2010  ExitCare® Patient Information ©2015 [employer]. This information is not intended to replace advice given to you by your health care provider. Make sure you discuss any questions you have with your health care provider.

## 2014-10-27 NOTE — ED Notes (Signed)
Patient complains of cough chest congestion vomiting that started about Two and a half weeks ago Patient states he has been coughing up yellowish brown mucous Has been using OTC medications with little relief

## 2015-02-28 ENCOUNTER — Encounter (HOSPITAL_COMMUNITY): Payer: Self-pay | Admitting: Emergency Medicine

## 2015-02-28 ENCOUNTER — Emergency Department (HOSPITAL_COMMUNITY)
Admission: EM | Admit: 2015-02-28 | Discharge: 2015-02-28 | Disposition: A | Discharge status: Home / Self Care | Payer: Commercial Managed Care - HMO | Source: Home / Self Care | Attending: Family Medicine | Admitting: Family Medicine

## 2015-02-28 DIAGNOSIS — H6091 Unspecified otitis externa, right ear: Secondary | ICD-10-CM | POA: Diagnosis not present

## 2015-02-28 MED ORDER — NEOMYCIN-POLYMYXIN-HC 3.5-10000-1 OT SUSP: 4.0000 [drp] | Freq: Three times a day (TID) | OTIC | Status: DC

## 2015-02-28 NOTE — ED Notes (Signed)
C/o right ear pain onset x6 days... Pain radiates down right side of jaw States he has been applying peroxide to ear w/no relief A&O x4... No acute distress.

## 2015-02-28 NOTE — ED Provider Notes (Signed)
CSN: [id number]     Arrival date & time 02/28/15  1513 History   First MD Initiated Contact with Patient 02/28/15 1601     Chief Complaint  Patient presents with  . Otalgia   (Consider location/radiation/quality/duration/timing/severity/associated sxs/prior Treatment) HPI  History obtained from patient:   LOCATION:right ear SEVERITY:6 DURATION:sudden onset Sunday CONTEXT:cleaning ear with peroxide and warm water,  QUALITY:fullness sensation MODIFYING FACTORS:none ASSOCIATED SYMPTOMS:pain radiating into right neck TIMING:constant OCCUPATION:     Past Medical History  Diagnosis Date  . Morbid obesity [facility])    Past Surgical History  Procedure Laterality Date  . Adenoidectomy    . Eye surgery  1971, 1981  . Laparoscopic gastric banding  10/19/2011    Procedure: LAPAROSCOPIC GASTRIC BANDING;  Surgeon: Lodema Pilot, DO;  Location: [facility];  Service: General;  Laterality: N/A;  . Eye surgery     Family History  Problem Relation Age of Onset  . Cancer Paternal Aunt   . Cancer Paternal Aunt   . Cancer Paternal Aunt    Social History  Substance Use Topics  . Smoking status: Never Smoker   . Smokeless tobacco: None  . Alcohol Use: Yes     Comment: maybe 2 beers per week or less    Review of Systems ROS +'ve right ear pain  Denies: HEADACHE, NAUSEA, ABDOMINAL PAIN, CHEST PAIN, CONGESTION, DYSURIA, SHORTNESS OF BREATH  Allergies  Review of patient's allergies indicates no known allergies.  Home Medications   Prior to Admission medications   Medication Sig Start Date End Date Taking? Authorizing Provider  azithromycin (ZITHROMAX Z-PAK) 250 MG tablet Take 2 tablet x 1 time today. Then from tomorrow take one tablet daily for 4 days. 10/27/14   Doreene Eland, MD  guaiFENesin-codeine (ROBITUSSIN AC) 100-10 MG/5ML syrup Take 5 mLs by mouth 3 (three) times daily as needed for cough. 10/27/14   Doreene Eland, MD   Meds Ordered and Administered this Visit  Medications -  No data to display  BP 138/95 mmHg  Pulse 86  Temp(Src) 98 F (36.7 C) (Oral)  Resp 16  SpO2 96% No data found.   Physical Exam  Constitutional: He appears well-developed and well-nourished. No distress.  HENT:  Head: Atraumatic. Macrocephalic.  Right Ear: There is swelling. A foreign body is present. Decreased hearing is noted.  Left Ear: Hearing, tympanic membrane, external ear and ear canal normal.  Nursing note and vitals reviewed.   ED Course  Procedures (including critical care time)  Labs Review Labs Reviewed - No data to display  Imaging Review No results found.   Visual Acuity Review  Right Eye Distance:   Left Eye Distance:   Bilateral Distance:    Right Eye Near:   Left Eye Near:    Bilateral Near:        Ear irrigated.  MDM   1. OE (otitis externa), right     Patient is advised to continue home symptomatic treatment. Prescription for Cortisporin Otic suspension sent pharmacy patient has indicated. Patient is advised that if there are new or worsening symptoms or attend the emergency department, or contact primary care provider. Instructions of care provided discharged home in stable condition.  THIS NOTE WAS GENERATED USING A VOICE RECOGNITION SOFTWARE PROGRAM. ALL REASONABLE EFFORTS  WERE MADE TO PROOFREAD THIS DOCUMENT FOR ACCURACY.     Tharon Aquas, PA 02/28/15 [phone]

## 2015-02-28 NOTE — Discharge Instructions (Signed)
Otitis Externa Otitis externa is a germ infection in the outer ear. The outer ear is the area from the eardrum to the outside of the ear. Otitis externa is sometimes called "swimmer's ear." HOME CARE  Put drops in the ear as told by your doctor.  Only take medicine as told by your doctor.  If you have diabetes, your doctor may give you more directions. Follow your doctor's directions.  Keep all doctor visits as told. To avoid another infection:  Keep your ear dry. Use the corner of a towel to dry your ear after swimming or bathing.  Avoid scratching or putting things inside your ear.  Avoid swimming in lakes, dirty water, or pools that use a chemical called chlorine poorly.  You may use ear drops after swimming. Combine equal amounts of white vinegar and alcohol in a bottle. Put 3 or 4 drops in each ear. GET HELP IF:   You have a fever.  Your ear is still red, puffy (swollen), or painful after 3 days.  You still have yellowish-white fluid (pus) coming from the ear after 3 days.  Your redness, puffiness, or pain gets worse.  You have a really bad headache.  You have redness, puffiness, pain, or tenderness behind your ear. MAKE SURE YOU:   Understand these instructions.  Will watch your condition.  Will get help right away if you are not doing well or get worse.   This information is not intended to replace advice given to you by your health care provider. Make sure you discuss any questions you have with your health care provider.   Document Released: 07/14/2007 Document Revised: 02/15/2014 Document Reviewed: 02/11/2011 Elsevier Interactive Patient Education 2016 [employer] Inc.  

## 2015-03-26 ENCOUNTER — Emergency Department (HOSPITAL_COMMUNITY)
Admission: EM | Admit: 2015-03-26 | Discharge: 2015-03-26 | Disposition: A | Discharge status: Home / Self Care | Payer: Commercial Managed Care - HMO | Attending: Physician Assistant | Admitting: Physician Assistant

## 2015-03-26 ENCOUNTER — Encounter (HOSPITAL_COMMUNITY): Payer: Self-pay

## 2015-03-26 DIAGNOSIS — H6691 Otitis media, unspecified, right ear: Secondary | ICD-10-CM | POA: Insufficient documentation

## 2015-03-26 DIAGNOSIS — H9190 Unspecified hearing loss, unspecified ear: Secondary | ICD-10-CM | POA: Insufficient documentation

## 2015-03-26 DIAGNOSIS — H9201 Otalgia, right ear: Secondary | ICD-10-CM | POA: Diagnosis present

## 2015-03-26 MED ORDER — HYDROGEN PEROXIDE 3 % EX SOLN
CUTANEOUS | Status: AC
Start: 2015-03-26 — End: 2015-03-26
  Administered 2015-03-26: 20:00:00
  Filled 2015-03-26: qty 473

## 2015-03-26 MED ORDER — AMOXICILLIN 500 MG PO CAPS
500.0000 mg | ORAL_CAPSULE | Freq: Once | ORAL | Status: AC
  Administered 2015-03-26: 500 mg via ORAL
  Filled 2015-03-26: qty 1

## 2015-03-26 MED ORDER — DOCUSATE SODIUM 50 MG/5ML PO LIQD
50.0000 mg | Freq: Once | ORAL | Status: AC
  Administered 2015-03-26: 50 mg via OTIC
  Filled 2015-03-26: qty 10

## 2015-03-26 MED ORDER — AMOXICILLIN 500 MG PO CAPS: 500.0000 mg | ORAL_CAPSULE | Freq: Three times a day (TID) | ORAL | Status: DC

## 2015-03-26 NOTE — ED Notes (Signed)
MD at bedside. 

## 2015-03-26 NOTE — ED Notes (Signed)
Patient c/o right ear infection. Patient states he has been to an [facility], [facility] and [facility]. Patient states he has been on 3 different ear drops for infection and continues to have right ear pain. Patient also c/o light yellow drainage. Patient denies any fever.

## 2015-03-26 NOTE — ED Provider Notes (Addendum)
CSN: [id number]     Arrival date & time 03/26/15  1632 History   First MD Initiated Contact with Patient 03/26/15 1924     Chief Complaint  Patient presents with  . Otitis Media  . Hypertension     (Consider location/radiation/quality/duration/timing/severity/associated sxs/prior Treatment) HPI  Patient is 50 year old male with morbid obesity presenting with ear pain. Patient's been sent by 3 providers for this. Initially seeing at [facility] urgent care. Given eardrops. Then seen 1 week later at his PCPs and given a different set of eardrops and third time he happened to have an appointmetn at [place] and they checked his ear too and gave him a third set of eardrops. Every time he had external pain i nhis ear with movemetn.  He said that initially he had Q-tip that were lodged in there which then became infected.   He says follow-up with ENT. He said he was frustrated because he was on hold for an hour and cannot get an appointment. Therefore he came to emergency department.  Today there is no ear pain,  however he feels he can't hear very well. He denies any fever. Denies any congestion. Denies any pain in his head or eyes.   Past Medical History  Diagnosis Date  . Morbid obesity [facility])    Past Surgical History  Procedure Laterality Date  . Adenoidectomy    . Eye surgery  1971, 1981  . Laparoscopic gastric banding  10/19/2011    Procedure: LAPAROSCOPIC GASTRIC BANDING;  Surgeon: Lodema Pilot, DO;  Location: [facility];  Service: General;  Laterality: N/A;  . Eye surgery     Family History  Problem Relation Age of Onset  . Cancer Paternal Aunt   . Cancer Paternal Aunt   . Cancer Paternal Aunt    Social History  Substance Use Topics  . Smoking status: Never Smoker   . Smokeless tobacco: Never Used  . Alcohol Use: Yes     Comment: maybe 2 beers per week or less    Review of Systems  Constitutional: Negative for fever and activity change.  HENT: Positive for ear discharge and hearing  loss. Negative for congestion, ear pain and mouth sores.   Respiratory: Negative for shortness of breath.   Cardiovascular: Negative for chest pain.  Gastrointestinal: Negative for abdominal pain.      Allergies  Review of patient's allergies indicates no known allergies.  Home Medications   Prior to Admission medications   Medication Sig Start Date End Date Taking? Authorizing Provider  Aspirin-Salicylamide-Caffeine (BC HEADACHE POWDER PO) Take 1 packet by mouth daily as needed (back pain).   Yes Historical Provider, MD  CIPRODEX otic suspension PLACE 4 GTS INTO AFFECTED EAR BID X 10 DAYS 03/05/15  Yes Historical Provider, MD  azithromycin (ZITHROMAX Z-PAK) 250 MG tablet Take 2 tablet x 1 time today. Then from tomorrow take one tablet daily for 4 days. Patient not taking: Reported on 03/26/2015 10/27/14   Doreene Eland, MD  guaiFENesin-codeine [facility]) 100-10 MG/5ML syrup Take 5 mLs by mouth 3 (three) times daily as needed for cough. Patient not taking: Reported on 03/26/2015 10/27/14   Doreene Eland, MD  neomycin-polymyxin-hydrocortisone (CORTISPORIN) 3.5-10000-1 otic suspension Place 4 drops into the right ear 3 (three) times daily. Patient not taking: Reported on 03/26/2015 02/28/15   Tharon Aquas, PA   BP 171/121 mmHg  Pulse 77  Temp(Src) 98.1 F (36.7 C) (Oral)  Resp 18  Ht  (1.778 m)  Wt  340 lb (154.223 kg)  BMI 48.78 kg/m2  SpO2 99% Physical Exam  Constitutional: He is oriented to person, place, and time. He appears well-nourished.  HENT:  Head: Normocephalic and atraumatic.  Right Ear: External ear normal.  Left Ear: External ear normal.  Mouth/Throat: Oropharynx is clear and moist. No oropharyngeal exudate.  Left TM normal. Right TM- ear canal filled with white substance  Eyes: Conjunctivae are normal.  Cardiovascular: Normal rate.   Pulmonary/Chest: Effort normal.  Neurological: He is oriented to person, place, and time.  Skin: Skin is warm and  dry. He is not diaphoretic.  Psychiatric: He has a normal mood and affect. His behavior is normal.    ED Course  .Foreign Body Removal Date/Time: 03/26/2015 9:58 PM Performed by: Bary Castilla LYN Authorized by: Bary Castilla LYN Consent: Verbal consent obtained. Consent given by: patient Body area: ear Location details: right ear Localization method: ENT speculum and visualized Removal mechanism: ear scoop Complexity: complex Patient tolerance: Patient tolerated the procedure well with no immediate complications Comments: Cleaned with peroxide, and scoppetted out multiple pieces of white material - ?q tip   (including critical care time) Labs Review Labs Reviewed - No data to display  Imaging Review No results found. I have personally reviewed and evaluated these images and lab results as part of my medical decision-making.   EKG Interpretation None      MDM   Final diagnoses:  None  Patietn  is a 50 year old male who's had a week and a half of the ear pain. He's had been treated multiple times with different antibiotics. Today presents with no more pain but difficulty hearing. In looking at his right ear he has white substance in teh cancal. We will cleared out with Angiocath and scopette. We will encourage him to follow up with ENT. We'll treat with oral antibiotics until follow-up with ENT.  No mastoid tenderness. No fevers. No cranial nerve changes.  Avenell Sellers Randall An, MD 03/26/15 2009    9:54 PM Used colace. Patient was greatly improved. Feels like he can hear now. Patient reports being much more comfortable. Tried to relook in patient ear and still difficulty see patient's TM secondary to bubbles. We will give short course of amoxicillin and have patient follow up with ENT.  Jakwan Sally Randall An, MD 03/26/15 2155  Josselyne Onofrio Randall An, MD 03/26/15 2159

## 2015-03-26 NOTE — Discharge Instructions (Signed)
Call ENT and schedule follow up this week.  Otitis Media, Adult Otitis media is redness, soreness, and puffiness (swelling) in the space just behind your eardrum (middle ear). It may be caused by allergies or infection. It often happens along with a cold. HOME CARE  Take your medicine as told. Finish it even if you start to feel better.  Only take over-the-counter or prescription medicines for pain, discomfort, or fever as told by your doctor.  Follow up with your doctor as told. GET HELP IF:  You have otitis media only in one ear, or bleeding from your nose, or both.  You notice a lump on your neck.  You are not getting better in 3-5 days.  You feel worse instead of better. GET HELP RIGHT AWAY IF:   You have pain that is not helped with medicine.  You have puffiness, redness, or pain around your ear.  You get a stiff neck.  You cannot move part of your face (paralysis).  You notice that the bone behind your ear hurts when you touch it. MAKE SURE YOU:   Understand these instructions.  Will watch your condition.  Will get help right away if you are not doing well or get worse.   This information is not intended to replace advice given to you by your health care provider. Make sure you discuss any questions you have with your health care provider.   Document Released: 07/14/2007 Document Revised: 02/15/2014 Document Reviewed: 08/22/2012 Elsevier Interactive Patient Education [employer]

## 2015-03-26 NOTE — ED Notes (Signed)
Cabinet override for Hydrogen Peroxide per verbal order from MD [facility]

## 2015-03-26 NOTE — ED Notes (Signed)
Pt turned to allow docusate to run out of ear

## 2015-04-24 DIAGNOSIS — Z9884 Bariatric surgery status: Secondary | ICD-10-CM | POA: Insufficient documentation

## 2015-04-24 DIAGNOSIS — G4730 Sleep apnea, unspecified: Secondary | ICD-10-CM | POA: Insufficient documentation

## 2015-04-24 DIAGNOSIS — I10 Essential (primary) hypertension: Secondary | ICD-10-CM | POA: Insufficient documentation

## 2015-09-05 ENCOUNTER — Encounter (HOSPITAL_COMMUNITY): Payer: Self-pay | Admitting: *Deleted

## 2015-09-05 ENCOUNTER — Ambulatory Visit (HOSPITAL_COMMUNITY)
Admission: EM | Admit: 2015-09-05 | Discharge: 2015-09-05 | Disposition: A | Discharge status: Home / Self Care | Payer: Commercial Managed Care - HMO | Attending: Emergency Medicine | Admitting: Emergency Medicine

## 2015-09-05 DIAGNOSIS — K5900 Constipation, unspecified: Secondary | ICD-10-CM | POA: Diagnosis not present

## 2015-09-05 MED ORDER — PEG 3350-KCL-NABCB-NACL-NASULF 236 G PO SOLR: ORAL | 0 refills | Status: DC

## 2015-09-05 NOTE — Discharge Instructions (Signed)
Your constipation may be coming from the weight loss you've been experiencing. Take the GoLYTELY as prescribed to clean your bowels out. Make sure you aren't drinking plenty of fluids and getting enough fiber in your diet to prevent recurring constipation. Follow-up with your PCP as needed.

## 2015-09-05 NOTE — ED Provider Notes (Signed)
[facility]    CSN: [id number] Arrival date & time: 09/05/15  1612  First Provider Contact:  First MD Initiated Contact with Patient 09/05/15 1655        History   Chief Complaint Chief Complaint  Patient presents with  . Abdominal Pain  . Constipation    [name] is a 50 y.o. male.   He is a 50 year old man here for evaluation of abdominal discomfort. He states over the last month he has had intermittent abdominal discomfort associated with constipation. He states it feels like there is a knot in his stomach. This resolves with OTC milk of magnesia for a few days, but then recurs.  He does have a lap band and has lost 16 pounds over the last 2 months. He states he is drinking lots of water. He denies any nausea or vomiting. His last bowel movement was yesterday. He denies straining, but states it was very small. No blood in the stool.    Past Medical History:  Diagnosis Date  . Morbid obesity [facility])     Patient Active Problem List   Diagnosis Date Noted  . DYSLIPIDEMIA 05/12/2007  . OBESITY 05/12/2007  . FOOT PAIN, CHRONIC 05/12/2007    Past Surgical History:  Procedure Laterality Date  . ADENOIDECTOMY    . EYE SURGERY  1971, 1981  . EYE SURGERY    . LAPAROSCOPIC GASTRIC BANDING  10/19/2011   Procedure: LAPAROSCOPIC GASTRIC BANDING;  Surgeon: Lodema Pilot, DO;  Location: [facility];  Service: General;  Laterality: N/A;       Home Medications    Prior to Admission medications   Medication Sig Start Date End Date Taking? Authorizing Provider  Aspirin-Salicylamide-Caffeine (BC HEADACHE POWDER PO) Take 1 packet by mouth daily as needed (back pain).    Historical Provider, MD  polyethylene glycol (GOLYTELY) 236 g solution Drink a glass every 10 minutes until gone or having clear diarrhea 09/05/15   Charm Rings, MD    Family History Family History  Problem Relation Age of Onset  . Cancer Paternal Aunt   . Cancer Paternal Aunt   . Cancer  Paternal Aunt     Social History Social History  Substance Use Topics  . Smoking status: Never Smoker  . Smokeless tobacco: Never Used  . Alcohol use Yes     Comment: maybe 2 beers per week or less     Allergies   Review of patient's allergies indicates no known allergies.   Review of Systems Review of Systems  Constitutional: Negative for fever.  Gastrointestinal: Positive for abdominal pain and constipation. Negative for blood in stool, nausea and vomiting.     Physical Exam Triage Vital Signs ED Triage Vitals  Enc Vitals Group     BP 09/05/15 1646 141/79     Pulse Rate 09/05/15 1646 84     Resp 09/05/15 1646 16     Temp 09/05/15 1646 98.6 F (37 C)     Temp Source 09/05/15 1646 Oral     SpO2 09/05/15 1646 96 %     Weight --      Height --      Head Circumference --      Peak Flow --      Pain Score 09/05/15 1708 3     Pain Loc --      Pain Edu? --      Excl. in GC? --    No data found.   Updated Vital  Signs BP 141/79 (BP Location: Left Arm)   Pulse 84   Temp 98.6 F (37 C) (Oral)   Resp 16   SpO2 96%   Visual Acuity Right Eye Distance:   Left Eye Distance:   Bilateral Distance:    Right Eye Near:   Left Eye Near:    Bilateral Near:     Physical Exam  Constitutional: He is oriented to person, place, and time. He appears well-developed and well-nourished. No distress.  Cardiovascular: Normal rate.   Pulmonary/Chest: Effort normal.  Abdominal: Soft. Bowel sounds are normal. He exhibits no distension and no mass. There is no tenderness. There is no guarding.  Neurological: He is alert and oriented to person, place, and time.     UC Treatments / Results  Labs (all labs ordered are listed, but only abnormal results are displayed) Labs Reviewed - No data to display  EKG  EKG Interpretation None       Radiology No results found.  Procedures Procedures (including critical care time)  Medications Ordered in UC Medications - No data  to display   Initial Impression / Assessment and Plan / UC Course  I have reviewed the triage vital signs and the nursing notes.  Pertinent labs & imaging results that were available during my care of the patient were reviewed by me and considered in my medical decision making (see chart for details).  Clinical Course    We'll treat with GoLYTELY. Recommended fluid intake and increased fiber in his diet. He may require a fiber supplement given the gastric banding. Follow-up with PCP if constipation recurs after bowel cleanout.  Final Clinical Impressions(s) / UC Diagnoses   Final diagnoses:  Constipation, unspecified constipation type    New Prescriptions New Prescriptions   POLYETHYLENE GLYCOL (GOLYTELY) 236 G SOLUTION    Drink a glass every 10 minutes until gone or having clear diarrhea     Charm Rings, MD 09/05/15 1726

## 2015-09-05 NOTE — ED Triage Notes (Signed)
Patient reports intermittent constipation x 1 month, reports mild relief with laxatives associated with bloating, abdominal distention, and mild discomfort. Patient reports no change in diet or medications.

## 2015-11-14 ENCOUNTER — Encounter (HOSPITAL_COMMUNITY): Payer: Self-pay | Admitting: Family Medicine

## 2015-11-14 ENCOUNTER — Ambulatory Visit (HOSPITAL_COMMUNITY)
Admission: EM | Admit: 2015-11-14 | Discharge: 2015-11-14 | Disposition: A | Discharge status: Home / Self Care | Payer: Commercial Managed Care - HMO | Attending: Family Medicine | Admitting: Family Medicine

## 2015-11-14 DIAGNOSIS — K5901 Slow transit constipation: Secondary | ICD-10-CM

## 2015-11-14 MED ORDER — POLYETHYLENE GLYCOL 3350 17 GM/SCOOP PO POWD: 17.0000 g | Freq: Every day | ORAL | 0 refills | Status: AC

## 2015-11-14 NOTE — ED Provider Notes (Signed)
CSN: [id number]     Arrival date & time 11/14/15  1613 History   First MD Initiated Contact with Patient 11/14/15 1709     Chief Complaint  Patient presents with  . Constipation   (Consider location/radiation/quality/duration/timing/severity/associated sxs/prior Treatment) 50 year old male presents with intermittent constipation for the past 3 months. Was seen here in July 2017 and was prescribed Golytely which helped but pattern still has not been normal. Now has a hard bowel movement every 2 or 3 days- previous pattern was daily. Has tried to increase fiber in his diet and take Ex-lax with minimal success. Did have an episode of diarrhea for 10 days after returning from the [place] 3 weeks ago but now has returned to the more constipated pattern. No other chronic health issues. Takes no daily medication.    The history is provided by the patient.    Past Medical History:  Diagnosis Date  . Morbid obesity (HCC)    Past Surgical History:  Procedure Laterality Date  . ADENOIDECTOMY    . EYE SURGERY  1971, 1981  . EYE SURGERY    . LAPAROSCOPIC GASTRIC BANDING  10/19/2011   Procedure: LAPAROSCOPIC GASTRIC BANDING;  Surgeon: Lodema Pilot, DO;  Location: [facility];  Service: General;  Laterality: N/A;   Family History  Problem Relation Age of Onset  . Cancer Paternal Aunt   . Cancer Paternal Aunt   . Cancer Paternal Aunt    Social History  Substance Use Topics  . Smoking status: Never Smoker  . Smokeless tobacco: Never Used  . Alcohol use Yes     Comment: maybe 2 beers per week or less    Review of Systems  Constitutional: Negative for appetite change, chills, fatigue, fever and unexpected weight change.  Respiratory: Negative for cough, chest tightness, shortness of breath and wheezing.   Cardiovascular: Negative for chest pain.  Gastrointestinal: Positive for constipation. Negative for abdominal distention, abdominal pain, blood in stool, nausea, rectal pain and  vomiting.  Genitourinary: Negative for difficulty urinating and dysuria.  Musculoskeletal: Negative for arthralgias, back pain and myalgias.  Skin: Negative for rash.  Neurological: Negative for dizziness, seizures, syncope, weakness, numbness and headaches.  Hematological: Negative for adenopathy. Does not bruise/bleed easily.    Allergies  Review of patient's allergies indicates no known allergies.  Home Medications   Prior to Admission medications   Medication Sig Start Date End Date Taking? Authorizing Provider  Aspirin-Salicylamide-Caffeine (BC HEADACHE POWDER PO) Take 1 packet by mouth daily as needed (back pain).    Historical Provider, MD  polyethylene glycol powder (GLYCOLAX) powder Take 17 g by mouth daily. Repeat every 6 to 8 hours as needed until loose stools 11/14/15   Sudie Grumbling, NP   Meds Ordered and Administered this Visit  Medications - No data to display  BP 137/91 (BP Location: Left Arm)   Pulse 90   Temp 99.1 F (37.3 C) (Oral)   Resp 16   SpO2 96%  No data found.   Physical Exam  Constitutional: He is oriented to person, place, and time. He appears well-developed and well-nourished. No distress.  HENT:  Head: Normocephalic and atraumatic.  Nose: Nose normal.  Mouth/Throat: Uvula is midline, oropharynx is clear and moist and mucous membranes are normal.  Cardiovascular: Normal rate, regular rhythm and normal heart sounds.   Pulmonary/Chest: Effort normal and breath sounds normal.  Abdominal: Soft. Normal appearance and bowel sounds are normal. He exhibits no mass. There is no hepatosplenomegaly.  There is no tenderness. There is no rigidity, no rebound, no guarding and no CVA tenderness.  Musculoskeletal: Normal range of motion.  Neurological: He is alert and oriented to person, place, and time.  Skin: Skin is warm and dry.  Psychiatric: He has a normal mood and affect. His behavior is normal. Judgment and thought content normal.    Urgent Care  Course   Clinical Course    Procedures (including critical care time)  Labs Review Labs Reviewed - No data to display  Imaging Review No results found.   Visual Acuity Review  Right Eye Distance:   Left Eye Distance:   Bilateral Distance:    Right Eye Near:   Left Eye Near:    Bilateral Near:         MDM   1. Slow transit constipation    Recommend Glycolax 1 heaping teaspoon - repeat every 6 to 8 hours until loose stools. Continue to increase fluids and fiber in diet. Increase exercise. Recommend referral to GI specialist for further evaluation. Patient will contact PCP for referral to GI specialist.     Sudie GrumblingAnn Berry Graclyn Lawther, NP 11/15/15 1842

## 2015-11-14 NOTE — ED Triage Notes (Signed)
Pt here for abd cramping and constipation. sts that he had this issue months ago and took go lytely and it helped. sts also [place] and had 10 days of diarrhea after. sts that he hasn't had a normal BM in 3 weeks.

## 2015-11-14 NOTE — Discharge Instructions (Signed)
Start Glycolax 1 heaping teaspoon every 6 to 8 hours as needed until loose stools. Recommend contact PCP for referral to GI specialist for further evaluation.

## 2016-01-09 ENCOUNTER — Other Ambulatory Visit: Payer: Self-pay | Admitting: Gastroenterology

## 2016-01-16 ENCOUNTER — Encounter (HOSPITAL_COMMUNITY): Admission: RE | Payer: Self-pay | Source: Ambulatory Visit

## 2016-01-16 ENCOUNTER — Ambulatory Visit (HOSPITAL_COMMUNITY)
Admission: RE | Admit: 2016-01-16 | Payer: Commercial Managed Care - HMO | Source: Ambulatory Visit | Admitting: Gastroenterology

## 2016-01-16 SURGERY — COLONOSCOPY WITH PROPOFOL
Anesthesia: Monitor Anesthesia Care

## 2016-03-01 ENCOUNTER — Ambulatory Visit: Payer: Commercial Managed Care - HMO | Admitting: Skilled Nursing Facility1

## 2016-03-12 ENCOUNTER — Other Ambulatory Visit: Payer: Self-pay | Admitting: Gastroenterology

## 2016-03-16 ENCOUNTER — Encounter (HOSPITAL_COMMUNITY): Payer: Self-pay | Admitting: *Deleted

## 2016-03-17 ENCOUNTER — Encounter (HOSPITAL_COMMUNITY)
Admission: RE | Disposition: A | Discharge status: Home / Self Care | Payer: Self-pay | Source: Ambulatory Visit | Attending: Gastroenterology

## 2016-03-17 ENCOUNTER — Encounter (HOSPITAL_COMMUNITY): Payer: Self-pay | Admitting: *Deleted

## 2016-03-17 ENCOUNTER — Ambulatory Visit (HOSPITAL_COMMUNITY): Payer: Commercial Managed Care - HMO | Admitting: Certified Registered Nurse Anesthetist

## 2016-03-17 ENCOUNTER — Ambulatory Visit (HOSPITAL_COMMUNITY)
Admission: RE | Admit: 2016-03-17 | Discharge: 2016-03-17 | Disposition: A | Discharge status: Home / Self Care | Payer: Commercial Managed Care - HMO | Source: Ambulatory Visit | Attending: Gastroenterology | Admitting: Gastroenterology

## 2016-03-17 DIAGNOSIS — E785 Hyperlipidemia, unspecified: Secondary | ICD-10-CM | POA: Diagnosis not present

## 2016-03-17 DIAGNOSIS — K5730 Diverticulosis of large intestine without perforation or abscess without bleeding: Secondary | ICD-10-CM | POA: Insufficient documentation

## 2016-03-17 DIAGNOSIS — E669 Obesity, unspecified: Secondary | ICD-10-CM | POA: Diagnosis not present

## 2016-03-17 DIAGNOSIS — M79673 Pain in unspecified foot: Secondary | ICD-10-CM | POA: Diagnosis not present

## 2016-03-17 DIAGNOSIS — Z1211 Encounter for screening for malignant neoplasm of colon: Secondary | ICD-10-CM | POA: Diagnosis not present

## 2016-03-17 DIAGNOSIS — R194 Change in bowel habit: Secondary | ICD-10-CM | POA: Insufficient documentation

## 2016-03-17 HISTORY — PX: COLONOSCOPY WITH PROPOFOL: SHX5780

## 2016-03-17 HISTORY — DX: Sleep apnea, unspecified: G47.30

## 2016-03-17 SURGERY — COLONOSCOPY WITH PROPOFOL
Anesthesia: Monitor Anesthesia Care

## 2016-03-17 MED ORDER — SODIUM CHLORIDE 0.9 % IV SOLN: INTRAVENOUS | Status: DC

## 2016-03-17 MED ORDER — PROPOFOL 500 MG/50ML IV EMUL
INTRAVENOUS | Status: DC | PRN
  Administered 2016-03-17: 150 ug/kg/min via INTRAVENOUS

## 2016-03-17 MED ORDER — PROPOFOL 10 MG/ML IV BOLUS
INTRAVENOUS | Status: AC
  Filled 2016-03-17: qty 20

## 2016-03-17 MED ORDER — PROPOFOL 10 MG/ML IV BOLUS
INTRAVENOUS | Status: DC | PRN
  Administered 2016-03-17: 30 mg via INTRAVENOUS
  Administered 2016-03-17: 50 mg via INTRAVENOUS
  Administered 2016-03-17: 30 mg via INTRAVENOUS

## 2016-03-17 MED ORDER — LACTATED RINGERS IV SOLN
INTRAVENOUS | Status: DC
  Administered 2016-03-17: 1000 mL via INTRAVENOUS

## 2016-03-17 MED ORDER — PROPOFOL 10 MG/ML IV BOLUS
INTRAVENOUS | Status: AC
  Filled 2016-03-17: qty 40

## 2016-03-17 MED ORDER — LIDOCAINE 2% (20 MG/ML) 5 ML SYRINGE
INTRAMUSCULAR | Status: DC | PRN
  Administered 2016-03-17: 100 mg via INTRAVENOUS

## 2016-03-17 SURGICAL SUPPLY — 22 items

## 2016-03-17 NOTE — Anesthesia Preprocedure Evaluation (Signed)
Anesthesia Evaluation  Patient identified by MRN, date of birth, ID band Patient awake    Reviewed: Allergy & Precautions, NPO status , Patient's Chart, lab work & pertinent test results  Airway Mallampati: II  TM Distance: >3 FB Neck ROM: Full    Dental no notable dental hx.    Pulmonary neg pulmonary ROS,    Pulmonary exam normal breath sounds clear to auscultation       Cardiovascular negative cardio ROS Normal cardiovascular exam Rhythm:Regular Rate:Normal     Neuro/Psych negative neurological ROS  negative psych ROS   GI/Hepatic negative GI ROS, Neg liver ROS,   Endo/Other  Morbid obesity  Renal/GU negative Renal ROS  negative genitourinary   Musculoskeletal negative musculoskeletal ROS (+)   Abdominal   Peds negative pediatric ROS (+)  Hematology negative hematology ROS (+)   Anesthesia Other Findings   Reproductive/Obstetrics negative OB ROS                             Anesthesia Physical Anesthesia Plan  ASA: III  Anesthesia Plan: MAC   Post-op Pain Management:    Induction:   Airway Management Planned: Simple Face Mask  Additional Equipment:   Intra-op Plan:   Post-operative Plan:   Informed Consent: I have reviewed the patients History and Physical, chart, labs and discussed the procedure including the risks, benefits and alternatives for the proposed anesthesia with the patient or authorized representative who has indicated his/her understanding and acceptance.   Dental advisory given  Plan Discussed with: CRNA  Anesthesia Plan Comments:         Anesthesia Quick Evaluation

## 2016-03-17 NOTE — H&P (Signed)
Patient interval history reviewed.  Patient examined again.  There has been no change from documented H/P dated 03/15/16 (scanned into chart from our office) except as documented above.  Assessment:  1.  Change in bowel habits.  Plan:   1.  Colonoscopy. 2.  Risks (bleeding, infection, bowel perforation that could require surgery, sedation-related changes in cardiopulmonary systems), benefits (identification and possible treatment of source of symptoms, exclusion of certain causes of symptoms), and alternatives (watchful waiting, radiographic imaging studies, empiric medical treatment) of colonoscopy were explained to patient/family in detail and patient wishes to proceed.

## 2016-03-17 NOTE — Op Note (Signed)
[facility] Patient Name: Jay Gonzalez Procedure Date: 03/17/2016 MRN: [id number] Attending MD: Willis Modena , MD Date of Birth: 02-08-1966 CSN: [id number] Age: 51 Admit Type: Outpatient Procedure:                Colonoscopy Indications:              This is the patient's first colonoscopy, Change in                            bowel habits Providers:                Willis Modena, MD, Priscella Mann, RN, Roselie Awkward, RN, Arlee Muslim Tech., Technician, Harrington Challenger, Technician, Randon Goldsmith, CRNA Referring MD:             Boneta Lucks, PA-C Medicines:                Monitored Anesthesia Care Complications:            No immediate complications. Estimated Blood Loss:     Estimated blood loss: none. Procedure:                Pre-Anesthesia Assessment:                           - Prior to the procedure, a History and Physical                            was performed, and patient medications and                            allergies were reviewed. The patient's tolerance of                            previous anesthesia was also reviewed. The risks                            and benefits of the procedure and the sedation                            options and risks were discussed with the patient.                            All questions were answered, and informed consent                            was obtained. Prior Anticoagulants: The patient has                            taken no previous anticoagulant or antiplatelet  agents. ASA Grade Assessment: III - A patient with                            severe systemic disease. After reviewing the risks                            and benefits, the patient was deemed in                            satisfactory condition to undergo the procedure.                           After obtaining informed consent, the colonoscope         was passed under direct vision. Throughout the                            procedure, the patient's blood pressure, pulse, and                            oxygen saturations were monitored continuously. The                            EC-[id number] ([id number]) scope was introduced through                            the anus and advanced to the the cecum, identified                            by appendiceal orifice and ileocecal valve. The                            ileocecal valve, appendiceal orifice, and rectum                            were photographed. The entire colon was examined.                            The colonoscopy was performed without difficulty.                            The patient tolerated the procedure well. The                            quality of the bowel preparation was good. Scope In: 11:50:35 AM Scope Out: 12:08:37 PM Scope Withdrawal Time: 0 hours 13 minutes 48 seconds  Total Procedure Duration: 0 hours 18 minutes 1 second  Findings:      The perianal and digital rectal examinations were normal.      A few small-mouthed diverticula were found in the sigmoid colon.      Colon otherwise normal; no other polyps, masses, vascular ectasias, or       inflammatory changes were seen.      The retroflexed view of the distal rectum and anal verge was normal and  showed no anal or rectal abnormalities. Impression:               - Diverticulosis in the sigmoid colon.                           - The distal rectum and anal verge are normal on                            retroflexion view.                           - The examination was otherwise normal. No colonic                            mass or stricture was seen. Moderate Sedation:      None Recommendation:           - Patient has a contact number available for                            emergencies. The signs and symptoms of potential                            delayed complications were discussed with the                             patient. Return to normal activities tomorrow.                            Written discharge instructions were provided to the                            patient.                           - Discharge patient to home (via wheelchair).                           - High fiber diet indefinitely.                           - Continue present medications.                           - Repeat colonoscopy in 10 years for screening                            purposes.                           - Return to GI clinic in 6 weeks.                           - Return to referring physician as previously                            scheduled.                           -  Use Benefiber one teaspoon PO BID indefinitely. Procedure Code(s):        --- Professional ---                           [phone], Colonoscopy, flexible; diagnostic, including                            collection of specimen(s) by brushing or washing,                            when performed (separate procedure) Diagnosis Code(s):        --- Professional ---                           R19.4, Change in bowel habit                           K57.30, Diverticulosis of large intestine without                            perforation or abscess without bleeding CPT copyright 2016 American Medical Association. All rights reserved. The codes documented in this report are preliminary and upon coder review may  be revised to meet current compliance requirements. Willis Modena, MD 03/17/2016 12:12:31 PM This report has been signed electronically. Number of Addenda: 0

## 2016-03-17 NOTE — Transfer of Care (Signed)
Immediate Anesthesia Transfer of Care Note  Patient: Jay Gonzalez  Procedure(s) Performed: Procedure(s): COLONOSCOPY WITH PROPOFOL (N/A)  Patient Location: PACU  Anesthesia Type:MAC  Level of Consciousness: Patient easily awoken, sedated, comfortable, cooperative, following commands, responds to stimulation.   Airway & Oxygen Therapy: Patient spontaneously breathing, ventilating well, oxygen via simple oxygen mask.  Post-op Assessment: Report given to PACU RN, vital signs reviewed and stable, moving all extremities.   Post vital signs: Reviewed and stable.  Complications: No apparent anesthesia complications  Last Vitals:  Vitals:   03/17/16 1002  BP: 136/86  Pulse: 72  Resp: 19  Temp: 36.9 C    Last Pain:  Vitals:   03/17/16 1002  TempSrc: Oral         Complications: No apparent anesthesia complications

## 2016-03-17 NOTE — Anesthesia Postprocedure Evaluation (Signed)
Anesthesia Post Note  Patient: Jay Gonzalez  Procedure(s) Performed: Procedure(s) (LRB): COLONOSCOPY WITH PROPOFOL (N/A)  Patient location during evaluation: Endoscopy Anesthesia Type: MAC Level of consciousness: awake and alert Pain management: pain level controlled Vital Signs Assessment: post-procedure vital signs reviewed and stable Respiratory status: spontaneous breathing, nonlabored ventilation, respiratory function stable and patient connected to nasal cannula oxygen Cardiovascular status: stable and blood pressure returned to baseline Anesthetic complications: no       Last Vitals:  Vitals:   03/17/16 1230 03/17/16 1239  BP:  129/86  Pulse: 74 68  Resp: 16 20  Temp:      Last Pain:  Vitals:   03/17/16 1215  TempSrc: Oral                 Montez Hageman

## 2016-03-17 NOTE — Discharge Instructions (Signed)

## 2016-03-18 ENCOUNTER — Encounter (HOSPITAL_COMMUNITY): Payer: Self-pay | Admitting: Gastroenterology

## 2016-04-01 DIAGNOSIS — I10 Essential (primary) hypertension: Secondary | ICD-10-CM | POA: Diagnosis not present

## 2016-04-24 DIAGNOSIS — Z0189 Encounter for other specified special examinations: Secondary | ICD-10-CM | POA: Diagnosis not present

## 2016-04-24 DIAGNOSIS — I10 Essential (primary) hypertension: Secondary | ICD-10-CM | POA: Diagnosis not present

## 2016-06-03 DIAGNOSIS — G4733 Obstructive sleep apnea (adult) (pediatric): Secondary | ICD-10-CM | POA: Diagnosis not present

## 2016-06-08 DIAGNOSIS — G4733 Obstructive sleep apnea (adult) (pediatric): Secondary | ICD-10-CM | POA: Diagnosis not present

## 2016-08-02 DIAGNOSIS — R194 Change in bowel habit: Secondary | ICD-10-CM | POA: Diagnosis not present

## 2016-08-02 DIAGNOSIS — Z1211 Encounter for screening for malignant neoplasm of colon: Secondary | ICD-10-CM | POA: Diagnosis not present

## 2017-01-04 DIAGNOSIS — Z23 Encounter for immunization: Secondary | ICD-10-CM | POA: Diagnosis not present

## 2017-01-04 DIAGNOSIS — G4733 Obstructive sleep apnea (adult) (pediatric): Secondary | ICD-10-CM | POA: Diagnosis not present

## 2017-01-04 DIAGNOSIS — Z0000 Encounter for general adult medical examination without abnormal findings: Secondary | ICD-10-CM | POA: Diagnosis not present

## 2017-04-01 DIAGNOSIS — G4730 Sleep apnea, unspecified: Secondary | ICD-10-CM | POA: Diagnosis not present

## 2017-04-01 DIAGNOSIS — M79672 Pain in left foot: Secondary | ICD-10-CM | POA: Diagnosis not present

## 2017-04-01 DIAGNOSIS — I10 Essential (primary) hypertension: Secondary | ICD-10-CM | POA: Diagnosis not present

## 2017-04-06 DIAGNOSIS — I10 Essential (primary) hypertension: Secondary | ICD-10-CM | POA: Diagnosis not present

## 2017-05-04 DIAGNOSIS — I10 Essential (primary) hypertension: Secondary | ICD-10-CM | POA: Diagnosis not present

## 2017-06-01 DIAGNOSIS — M2141 Flat foot [pes planus] (acquired), right foot: Secondary | ICD-10-CM | POA: Diagnosis not present

## 2017-06-01 DIAGNOSIS — M19072 Primary osteoarthritis, left ankle and foot: Secondary | ICD-10-CM | POA: Diagnosis not present

## 2017-06-01 DIAGNOSIS — M25571 Pain in right ankle and joints of right foot: Secondary | ICD-10-CM | POA: Diagnosis not present

## 2017-06-01 DIAGNOSIS — M722 Plantar fascial fibromatosis: Secondary | ICD-10-CM | POA: Diagnosis not present

## 2017-06-15 DIAGNOSIS — I10 Essential (primary) hypertension: Secondary | ICD-10-CM | POA: Diagnosis not present

## 2017-06-23 ENCOUNTER — Ambulatory Visit: Payer: 59 | Admitting: Podiatry

## 2017-06-23 ENCOUNTER — Ambulatory Visit (INDEPENDENT_AMBULATORY_CARE_PROVIDER_SITE_OTHER): Payer: 59

## 2017-06-23 ENCOUNTER — Encounter: Payer: Self-pay | Admitting: Podiatry

## 2017-06-23 VITALS — BP 139/91 | HR 78 | Resp 16 | Ht 70.0 in | Wt 325.0 lb

## 2017-06-23 DIAGNOSIS — M779 Enthesopathy, unspecified: Secondary | ICD-10-CM

## 2017-06-23 DIAGNOSIS — M25571 Pain in right ankle and joints of right foot: Secondary | ICD-10-CM | POA: Diagnosis not present

## 2017-06-23 DIAGNOSIS — M7751 Other enthesopathy of right foot: Secondary | ICD-10-CM

## 2017-06-23 MED ORDER — TRIAMCINOLONE ACETONIDE 10 MG/ML IJ SUSP
10.0000 mg | Freq: Once | INTRAMUSCULAR | Status: AC
  Administered 2017-06-23: 10 mg

## 2017-06-23 MED ORDER — DICLOFENAC SODIUM 75 MG PO TBEC: 75.0000 mg | DELAYED_RELEASE_TABLET | Freq: Two times a day (BID) | ORAL | 2 refills | Status: DC

## 2017-06-23 NOTE — Progress Notes (Signed)
Subjective:   Patient ID: Jay Gonzalez, male   DOB: 52 y.o.   MRN: [id number]   HPI Patient presents with approximate 1 to 50-month history of pain in the back of the right heel and also has had long-term history of plantar fasciitis is having a soft orthotic made at [facility] but feels like he is going to need a better device over the future.  Patient does not smoke likes to be active   Review of Systems  All other systems reviewed and are negative.       Objective:  Physical Exam  Constitutional: He appears well-developed and well-nourished.  Cardiovascular: Intact distal pulses.  Pulmonary/Chest: Effort normal.  Musculoskeletal: Normal range of motion.  Neurological: He is alert.  Skin: Skin is warm.  Nursing note and vitals reviewed.   Neurovascular status found to be intact muscle strength is adequate range of motion within normal limits with patient noted to have exquisite posterior pain in the posterior lateral aspect of the right heel that is very tender when pressed and is making walking difficult.  Patient has mild plantar pain but nowhere near to the same degree and does have flatfoot deformity noted bilateral.  Patient is noted to have good digital perfusion and is well oriented x3     Assessment:  Appears to be acute lateral Achilles tendinitis right at the insertion with mild to moderate plantar fasciitis bilateral and obesity is complicating factor     Plan:  H&P x-rays reviewed conditions discussed.  I discussed injection and possibility of risks and possibility of rupture and patient wants to undergo procedure today I carefully injected the lateral side 3 mg Dexasone Kenalog 5 mg Xylocaine advised on stretching exercises ice therapy and I did dispense air fracture walker to mobilize to keep stress office and will wear this support of boot keep it immobilized.  Also discussed long-term a more rigid type orthotic and will make that decision when I see him back and I do  think would be best to lift his heels to take pressure off the Achilles tendon.  Placed on anti-inflammatory diclofenac and gave instructions for physical therapy  X-rays indicate spur formation of the posterior and plantar heel right

## 2017-06-23 NOTE — Patient Instructions (Signed)

## 2017-06-23 NOTE — Progress Notes (Signed)
   Subjective:    Patient ID: Jay Gonzalez, male    DOB: 1965-04-24, 52 y.o.   MRN: [id number]  HPI    Review of Systems  All other systems reviewed and are negative.      Objective:   Physical Exam        Assessment & Plan:

## 2017-07-05 DIAGNOSIS — G4733 Obstructive sleep apnea (adult) (pediatric): Secondary | ICD-10-CM | POA: Diagnosis not present

## 2017-07-11 DIAGNOSIS — G4733 Obstructive sleep apnea (adult) (pediatric): Secondary | ICD-10-CM | POA: Diagnosis not present

## 2017-07-14 ENCOUNTER — Ambulatory Visit: Payer: 59 | Admitting: Podiatry

## 2017-07-14 ENCOUNTER — Encounter: Payer: Self-pay | Admitting: Podiatry

## 2017-07-14 DIAGNOSIS — M7661 Achilles tendinitis, right leg: Secondary | ICD-10-CM

## 2017-07-17 NOTE — Progress Notes (Signed)
Subjective:   Patient ID: Jay Gonzalez, male   DOB: 52 y.o.   MRN: [id number]   HPI Patient presents stating he still having a lot of pain in the back of the right heel and the medication in the boot only helped temporarily and the pain returns   ROS      Objective:  Physical Exam  Neurovascular status intact with exquisite discomfort of the posterior right heel at the insertion of the tendon into the calcaneus with no indication of muscle strength loss     Assessment:  Achilles tendinitis right at the insertion     Plan:  Advised patient on physical therapy for this and I am sending him to physical therapy and continued reduction of pressure on the area and shoe gear modifications.  Referral to physical therapy done at this time

## 2017-07-20 NOTE — Addendum Note (Signed)
Addended by: Alphia Kava'CONNELL, VALERY D on: 07/20/2017 11:15 AM   Modules accepted: Orders

## 2017-07-25 DIAGNOSIS — M25571 Pain in right ankle and joints of right foot: Secondary | ICD-10-CM | POA: Diagnosis not present

## 2017-07-25 DIAGNOSIS — R269 Unspecified abnormalities of gait and mobility: Secondary | ICD-10-CM | POA: Diagnosis not present

## 2017-07-25 DIAGNOSIS — M25671 Stiffness of right ankle, not elsewhere classified: Secondary | ICD-10-CM | POA: Diagnosis not present

## 2017-08-25 ENCOUNTER — Ambulatory Visit: Payer: 59 | Admitting: Podiatry

## 2017-09-22 DIAGNOSIS — M722 Plantar fascial fibromatosis: Secondary | ICD-10-CM | POA: Diagnosis not present

## 2017-09-22 DIAGNOSIS — M7731 Calcaneal spur, right foot: Secondary | ICD-10-CM | POA: Diagnosis not present

## 2017-09-22 DIAGNOSIS — M71571 Other bursitis, not elsewhere classified, right ankle and foot: Secondary | ICD-10-CM | POA: Diagnosis not present

## 2017-09-22 DIAGNOSIS — M79671 Pain in right foot: Secondary | ICD-10-CM | POA: Diagnosis not present

## 2017-09-22 DIAGNOSIS — M7661 Achilles tendinitis, right leg: Secondary | ICD-10-CM | POA: Diagnosis not present

## 2018-01-04 ENCOUNTER — Other Ambulatory Visit: Payer: Self-pay | Admitting: Podiatry

## 2018-03-03 DIAGNOSIS — Z125 Encounter for screening for malignant neoplasm of prostate: Secondary | ICD-10-CM | POA: Diagnosis not present

## 2018-03-03 DIAGNOSIS — Z13220 Encounter for screening for lipoid disorders: Secondary | ICD-10-CM | POA: Diagnosis not present

## 2018-03-03 DIAGNOSIS — Z0000 Encounter for general adult medical examination without abnormal findings: Secondary | ICD-10-CM | POA: Diagnosis not present

## 2018-03-03 DIAGNOSIS — I10 Essential (primary) hypertension: Secondary | ICD-10-CM | POA: Diagnosis not present

## 2018-03-06 DIAGNOSIS — M7661 Achilles tendinitis, right leg: Secondary | ICD-10-CM | POA: Diagnosis not present

## 2018-03-06 DIAGNOSIS — M25571 Pain in right ankle and joints of right foot: Secondary | ICD-10-CM | POA: Diagnosis not present

## 2018-03-06 DIAGNOSIS — G8929 Other chronic pain: Secondary | ICD-10-CM | POA: Diagnosis not present

## 2018-06-12 DIAGNOSIS — M7661 Achilles tendinitis, right leg: Secondary | ICD-10-CM | POA: Diagnosis not present

## 2018-06-12 DIAGNOSIS — M71571 Other bursitis, not elsewhere classified, right ankle and foot: Secondary | ICD-10-CM | POA: Diagnosis not present

## 2018-07-04 DIAGNOSIS — G4733 Obstructive sleep apnea (adult) (pediatric): Secondary | ICD-10-CM | POA: Diagnosis not present

## 2019-05-27 ENCOUNTER — Other Ambulatory Visit: Payer: Self-pay

## 2019-05-27 ENCOUNTER — Emergency Department (HOSPITAL_COMMUNITY)
Admission: EM | Admit: 2019-05-27 | Discharge: 2019-05-27 | Disposition: A | Discharge status: Home / Self Care | Payer: 59 | Attending: Emergency Medicine | Admitting: Emergency Medicine

## 2019-05-27 ENCOUNTER — Emergency Department (HOSPITAL_COMMUNITY): Payer: 59

## 2019-05-27 ENCOUNTER — Encounter (HOSPITAL_COMMUNITY): Payer: Self-pay | Admitting: Emergency Medicine

## 2019-05-27 DIAGNOSIS — R079 Chest pain, unspecified: Secondary | ICD-10-CM | POA: Diagnosis not present

## 2019-05-27 DIAGNOSIS — I10 Essential (primary) hypertension: Secondary | ICD-10-CM

## 2019-05-27 HISTORY — DX: Essential (primary) hypertension: I10

## 2019-05-27 LAB — COMPREHENSIVE METABOLIC PANEL
ALT: 22 U/L (ref 0–44)
AST: 22 U/L (ref 15–41)
Albumin: 3.9 g/dL (ref 3.5–5.0)
Alkaline Phosphatase: 70 U/L (ref 38–126)
Anion gap: 10 (ref 5–15)
BUN: 17 mg/dL (ref 6–20)
CO2: 26 mmol/L (ref 22–32)
Calcium: 9.1 mg/dL (ref 8.9–10.3)
Chloride: 103 mmol/L (ref 98–111)
Creatinine, Ser: 0.93 mg/dL (ref 0.61–1.24)
GFR calc Af Amer: >60 mL/min (ref >60)
GFR calc non Af Amer: >60 mL/min (ref >60)
Glucose, Bld: 98 mg/dL (ref 70–99)
Potassium: 3.9 mmol/L (ref 3.5–5.1)
Sodium: 139 mmol/L (ref 135–145)
Total Bilirubin: 0.6 mg/dL (ref 0.3–1.2)
Total Protein: 7.6 g/dL (ref 6.5–8.1)

## 2019-05-27 LAB — CBC
HCT: 45.8 % (ref 39.0–52.0)
Hemoglobin: 15.3 g/dL (ref 13.0–17.0)
MCH: 29.1 pg (ref 26.0–34.0)
MCHC: 33.4 g/dL (ref 30.0–36.0)
MCV: 87.1 fL (ref 80.0–100.0)
Platelets: 231 10*3/uL (ref 150–400)
RBC: 5.26 MIL/uL (ref 4.22–5.81)
RDW: 14.3 % (ref 11.5–15.5)
WBC: 8.6 10*3/uL (ref 4.0–10.5)
nRBC: 0 % (ref 0.0–0.2)

## 2019-05-27 LAB — TROPONIN I (HIGH SENSITIVITY): Troponin I (High Sensitivity): 3 ng/L (ref <18)

## 2019-05-27 NOTE — ED Triage Notes (Signed)
Per pt, states he was at work when he became light headed-states he felt his BP was high-states the nurse at work told him to come to ED-denies CP, dyspnea upon exertion

## 2019-05-27 NOTE — Discharge Instructions (Signed)
You were evaluated in the Emergency Department and after careful evaluation, we did not find any emergent condition requiring admission or further testing in the hospital.  Your exam/testing today was overall reassuring.  Please return to the Emergency Department if you experience any worsening of your condition.  We encourage you to follow up with a primary care provider.  Thank you for allowing [place] to be a part of your care. s

## 2019-05-27 NOTE — ED Provider Notes (Signed)
[facility]-EMERGENCY DEPT [facility] Emergency Department Provider Note MRN:  [id number]  Arrival date & time: 05/27/19     Chief Complaint   Hypertension   History of Present Illness   Jay Gonzalez is a 54 y.o. year-old male with a history of hypertension, sleep apnea, obesity presenting to the ED with chief complaint of hypertension.  Patient has been having intermittent lightheadedness, right arm pain past 2 days.  Had some dull pressure-like left-sided chest pain last night, lasting only a few minutes.  Felt generally unwell at work today, checked his blood pressure and it was 140/108 and this concerned him and so he is in the emergency department.  Denies any fever or cough, no nausea or vomiting, no shortness of breath, no leg pain or swelling.  Review of Systems  A complete 10 system review of systems was obtained and all systems are negative except as noted in the HPI and PMH.   Patient's Health History    Past Medical History:  Diagnosis Date  . Hypertension   . Morbid obesity (HCC)   . Sleep apnea     Past Surgical History:  Procedure Laterality Date  . ADENOIDECTOMY    . COLONOSCOPY WITH PROPOFOL N/A 03/17/2016   Procedure: COLONOSCOPY WITH PROPOFOL;  Surgeon: Willis Modena, MD;  Location: [facility] ENDOSCOPY;  Service: Endoscopy;  Laterality: N/A;  . EYE SURGERY  1971, 1981  . EYE SURGERY    . LAPAROSCOPIC GASTRIC BANDING  10/19/2011   Procedure: LAPAROSCOPIC GASTRIC BANDING;  Surgeon: Lodema Pilot, DO;  Location: [facility] ORS;  Service: General;  Laterality: N/A;    Family History  Problem Relation Age of Onset  . Cancer Paternal Aunt   . Cancer Paternal Aunt   . Cancer Paternal Aunt     Social History   Socioeconomic History  . Marital status: Single    Spouse name: Not on file  . Number of children: Not on file  . Years of education: Not on file  . Highest education level: Not on file  Occupational History  . Not on file  Tobacco Use  . Smoking status: Never  Smoker  . Smokeless tobacco: Never Used  Substance and Sexual Activity  . Alcohol use: Yes    Comment: maybe 2 beers per week or less  . Drug use: No  . Sexual activity: Not on file  Other Topics Concern  . Not on file  Social History Narrative  . Not on file   Social Determinants of Health   Financial Resource Strain:   . Difficulty of Paying Living Expenses:   Food Insecurity:   . Worried About Programme researcher, broadcasting/film/video in the Last Year:   . Barista in the Last Year:   Transportation Needs:   . Freight forwarder (Medical):   Marland Kitchen Lack of Transportation (Non-Medical):   Physical Activity:   . Days of Exercise per Week:   . Minutes of Exercise per Session:   Stress:   . Feeling of Stress :   Social Connections:   . Frequency of Communication with Friends and Family:   . Frequency of Social Gatherings with Friends and Family:   . Attends Religious Services:   . Active Member of Clubs or Organizations:   . Attends Banker Meetings:   Marland Kitchen Marital Status:   Intimate Partner Violence:   . Fear of Current or Ex-Partner:   . Emotionally Abused:   Marland Kitchen Physically Abused:   . Sexually Abused:  Physical Exam   Vitals:   05/27/19 1218  BP: 134/87  Pulse: 81  Resp: 18  Temp: 97.9 F (36.6 C)  SpO2: 95%    CONSTITUTIONAL: Well-appearing, NAD NEURO:  Alert and oriented x 3, no focal deficits EYES:  eyes equal and reactive ENT/NECK:  no LAD, no JVD CARDIO: Regular rate, well-perfused, normal S1 and S2 PULM:  CTAB no wheezing or rhonchi GI/GU:  normal bowel sounds, non-distended, non-tender MSK/SPINE:  No gross deformities, no edema SKIN:  no rash, atraumatic PSYCH:  Appropriate speech and behavior  *Additional and/or pertinent findings included in MDM below  Diagnostic and Interventional Summary    EKG Interpretation  Date/Time:  Sunday May 27 2019 12:23:10 EDT Ventricular Rate:  84 PR Interval:    QRS Duration: 92 QT Interval:  379 QTC  Calculation: 448 R Axis:   -20 Text Interpretation: Sinus rhythm Borderline left axis deviation Borderline T abnormalities, inferior leads 12 Lead; Mason-Likar Confirmed by Gerlene Fee [id number]) on 05/27/2019 12:36:37 PM      Labs Reviewed  CBC  COMPREHENSIVE METABOLIC PANEL  TROPONIN I (HIGH SENSITIVITY)    DG Chest Port 1 View  Final Result      Medications - No data to display   Procedures  /  Critical Care Procedures  ED Course and Medical Decision Making  I have reviewed the triage vital signs, the nursing notes, and pertinent available records from the EMR.  Listed above are laboratory and imaging tests that I personally ordered, reviewed, and interpreted and then considered in my medical decision making (see below for details).      Brief chest pain last night, associated with some lightheadedness, right-sided arm pain.  In general very mild presentation, currently without chest pain, fairly atypical.  EKG without acute concerns.  Doubt acute coronary syndrome, nothing to suggest PE or dissection.  Will obtain screening EKG and troponin given the chest pain, will screen kidney function given this report of hypertension.  Anticipating discharge with PCP follow-up.  Work-up reassuring, appropriate for discharge.  Barth Kirks. Sedonia Small, [place] [email or web address]  Final Clinical Impressions(s) / ED Diagnoses     ICD-10-CM   1. Chest pain, unspecified type  R07.9   2. Hypertension, unspecified type  I10     ED Discharge Orders    None       Discharge Instructions Discussed with and Provided to Patient:     Discharge Instructions     You were evaluated in the Emergency Department and after careful evaluation, we did not find any emergent condition requiring admission or further testing in the hospital.  Your exam/testing today was overall reassuring.  Please return to the Emergency Department if you experience  any worsening of your condition.  We encourage you to follow up with a primary care provider.  Thank you for allowing [place] to be a part of your care. s       Maudie Flakes, MD 05/27/19 [phone]

## 2019-10-04 ENCOUNTER — Encounter (HOSPITAL_COMMUNITY): Payer: Self-pay | Admitting: *Deleted

## 2019-10-04 ENCOUNTER — Other Ambulatory Visit: Payer: Self-pay

## 2019-10-04 ENCOUNTER — Emergency Department (HOSPITAL_COMMUNITY): Payer: 59

## 2019-10-04 DIAGNOSIS — R0602 Shortness of breath: Secondary | ICD-10-CM | POA: Insufficient documentation

## 2019-10-04 DIAGNOSIS — R05 Cough: Secondary | ICD-10-CM | POA: Insufficient documentation

## 2019-10-04 DIAGNOSIS — Z5321 Procedure and treatment not carried out due to patient leaving prior to being seen by health care provider: Secondary | ICD-10-CM | POA: Insufficient documentation

## 2019-10-04 DIAGNOSIS — R509 Fever, unspecified: Secondary | ICD-10-CM | POA: Insufficient documentation

## 2019-10-04 DIAGNOSIS — U071 COVID-19: Secondary | ICD-10-CM | POA: Diagnosis not present

## 2019-10-04 DIAGNOSIS — R5383 Other fatigue: Secondary | ICD-10-CM | POA: Insufficient documentation

## 2019-10-04 NOTE — ED Triage Notes (Signed)
Pt reporting that Sunday night he started having fatigue. Then onset of cough, shortness of breath, fevers. Denies sick exposure.

## 2019-10-04 NOTE — ED Notes (Signed)
No answer for re-assessment.

## 2019-10-05 ENCOUNTER — Emergency Department (HOSPITAL_COMMUNITY): Payer: 59

## 2019-10-05 ENCOUNTER — Emergency Department (HOSPITAL_COMMUNITY)
Admission: EM | Admit: 2019-10-05 | Discharge: 2019-10-05 | Disposition: A | Discharge status: Home / Self Care | Payer: 59 | Source: Home / Self Care

## 2019-10-05 ENCOUNTER — Inpatient Hospital Stay (HOSPITAL_COMMUNITY)
Admission: EM | Admit: 2019-10-05 | Discharge: 2019-10-08 | DRG: 177 | Disposition: A | Discharge status: Home / Self Care | Payer: 59 | Attending: Internal Medicine | Admitting: Internal Medicine

## 2019-10-05 ENCOUNTER — Encounter (HOSPITAL_COMMUNITY): Payer: Self-pay | Admitting: Emergency Medicine

## 2019-10-05 DIAGNOSIS — Z6841 Body Mass Index (BMI) 40.0 and over, adult: Secondary | ICD-10-CM

## 2019-10-05 DIAGNOSIS — N179 Acute kidney failure, unspecified: Secondary | ICD-10-CM | POA: Diagnosis present

## 2019-10-05 DIAGNOSIS — E669 Obesity, unspecified: Secondary | ICD-10-CM | POA: Diagnosis present

## 2019-10-05 DIAGNOSIS — J1282 Pneumonia due to coronavirus disease 2019: Secondary | ICD-10-CM | POA: Diagnosis not present

## 2019-10-05 DIAGNOSIS — I10 Essential (primary) hypertension: Secondary | ICD-10-CM | POA: Diagnosis not present

## 2019-10-05 DIAGNOSIS — U071 COVID-19: Principal | ICD-10-CM | POA: Diagnosis present

## 2019-10-05 DIAGNOSIS — R0602 Shortness of breath: Secondary | ICD-10-CM | POA: Diagnosis present

## 2019-10-05 DIAGNOSIS — Z79899 Other long term (current) drug therapy: Secondary | ICD-10-CM | POA: Diagnosis not present

## 2019-10-05 DIAGNOSIS — J9601 Acute respiratory failure with hypoxia: Secondary | ICD-10-CM | POA: Diagnosis not present

## 2019-10-05 DIAGNOSIS — G4730 Sleep apnea, unspecified: Secondary | ICD-10-CM | POA: Diagnosis present

## 2019-10-05 DIAGNOSIS — G4733 Obstructive sleep apnea (adult) (pediatric): Secondary | ICD-10-CM | POA: Diagnosis present

## 2019-10-05 LAB — CBC WITH DIFFERENTIAL/PLATELET
Abs Immature Granulocytes: 0.04 10*3/uL (ref 0.00–0.07)
Basophils Absolute: 0 10*3/uL (ref 0.0–0.1)
Basophils Relative: 0 %
Eosinophils Absolute: 0 10*3/uL (ref 0.0–0.5)
Eosinophils Relative: 0 %
HCT: 48.2 % (ref 39.0–52.0)
Hemoglobin: 16.3 g/dL (ref 13.0–17.0)
Immature Granulocytes: 1 %
Lymphocytes Relative: 9 %
Lymphs Abs: 0.7 10*3/uL (ref 0.7–4.0)
MCH: 28.9 pg (ref 26.0–34.0)
MCHC: 33.8 g/dL (ref 30.0–36.0)
MCV: 85.5 fL (ref 80.0–100.0)
Monocytes Absolute: 0.5 10*3/uL (ref 0.1–1.0)
Monocytes Relative: 6 %
Neutro Abs: 6.6 10*3/uL (ref 1.7–7.7)
Neutrophils Relative %: 84 %
Platelets: 162 10*3/uL (ref 150–400)
RBC: 5.64 MIL/uL (ref 4.22–5.81)
RDW: 14.4 % (ref 11.5–15.5)
WBC: 7.8 10*3/uL (ref 4.0–10.5)
nRBC: 0 % (ref 0.0–0.2)

## 2019-10-05 LAB — PROCALCITONIN: Procalcitonin: 0.5 ng/mL

## 2019-10-05 LAB — CBC
HCT: 44.6 % (ref 39.0–52.0)
Hemoglobin: 15.3 g/dL (ref 13.0–17.0)
MCH: 29.2 pg (ref 26.0–34.0)
MCHC: 34.3 g/dL (ref 30.0–36.0)
MCV: 85.1 fL (ref 80.0–100.0)
Platelets: 159 10*3/uL (ref 150–400)
RBC: 5.24 MIL/uL (ref 4.22–5.81)
RDW: 14.1 % (ref 11.5–15.5)
WBC: 6.6 10*3/uL (ref 4.0–10.5)
nRBC: 0 % (ref 0.0–0.2)

## 2019-10-05 LAB — TROPONIN I (HIGH SENSITIVITY)
Troponin I (High Sensitivity): 7 ng/L (ref <18)
Troponin I (High Sensitivity): 7 ng/L (ref <18)

## 2019-10-05 LAB — LACTIC ACID, PLASMA
Lactic Acid, Venous: 1.1 mmol/L (ref 0.5–1.9)
Lactic Acid, Venous: 0.9 mmol/L (ref 0.5–1.9)

## 2019-10-05 LAB — COMPREHENSIVE METABOLIC PANEL
ALT: 39 U/L (ref 0–44)
AST: 52 U/L — ABNORMAL HIGH (ref 15–41)
Albumin: 3.9 g/dL (ref 3.5–5.0)
Alkaline Phosphatase: 76 U/L (ref 38–126)
Anion gap: 12 (ref 5–15)
BUN: 24 mg/dL — ABNORMAL HIGH (ref 6–20)
CO2: 23 mmol/L (ref 22–32)
Calcium: 8.2 mg/dL — ABNORMAL LOW (ref 8.9–10.3)
Chloride: 97 mmol/L — ABNORMAL LOW (ref 98–111)
Creatinine, Ser: 1.48 mg/dL — ABNORMAL HIGH (ref 0.61–1.24)
GFR calc Af Amer: >60 mL/min (ref >60)
GFR calc non Af Amer: 54 mL/min — ABNORMAL LOW (ref >60)
Glucose, Bld: 114 mg/dL — ABNORMAL HIGH (ref 70–99)
Potassium: 4.2 mmol/L (ref 3.5–5.1)
Sodium: 132 mmol/L — ABNORMAL LOW (ref 135–145)
Total Bilirubin: 0.6 mg/dL (ref 0.3–1.2)
Total Protein: 8.1 g/dL (ref 6.5–8.1)

## 2019-10-05 LAB — CREATININE, SERUM
Creatinine, Ser: 1.48 mg/dL — ABNORMAL HIGH (ref 0.61–1.24)
GFR calc Af Amer: >60 mL/min (ref >60)
GFR calc non Af Amer: 54 mL/min — ABNORMAL LOW (ref >60)

## 2019-10-05 LAB — C-REACTIVE PROTEIN: CRP: 10.6 mg/dL — ABNORMAL HIGH (ref <1.0)

## 2019-10-05 LAB — TRIGLYCERIDES: Triglycerides: 77 mg/dL (ref <150)

## 2019-10-05 LAB — FERRITIN: Ferritin: 1107 ng/mL — ABNORMAL HIGH (ref 24–336)

## 2019-10-05 LAB — SARS CORONAVIRUS 2 BY RT PCR (HOSPITAL ORDER, PERFORMED IN ~~LOC~~ HOSPITAL LAB): SARS Coronavirus 2: POSITIVE — AB

## 2019-10-05 LAB — FIBRINOGEN: Fibrinogen: 504 mg/dL — ABNORMAL HIGH (ref 210–475)

## 2019-10-05 LAB — LACTATE DEHYDROGENASE: LDH: 336 U/L — ABNORMAL HIGH (ref 98–192)

## 2019-10-05 LAB — D-DIMER, QUANTITATIVE: D-Dimer, Quant: 0.76 ug/mL-FEU — ABNORMAL HIGH (ref 0.00–0.50)

## 2019-10-05 LAB — ABO/RH: ABO/RH(D): O POS

## 2019-10-05 MED ORDER — METOPROLOL TARTRATE 25 MG PO TABS
25.0000 mg | ORAL_TABLET | Freq: Two times a day (BID) | ORAL | Status: DC
  Administered 2019-10-05 – 2019-10-08 (×7): 25 mg via ORAL
  Filled 2019-10-05 (×7): qty 1

## 2019-10-05 MED ORDER — ASCORBIC ACID 500 MG PO TABS
500.0000 mg | ORAL_TABLET | Freq: Every day | ORAL | Status: DC
  Administered 2019-10-05 – 2019-10-08 (×4): 500 mg via ORAL
  Filled 2019-10-05 (×4): qty 1

## 2019-10-05 MED ORDER — ACETAMINOPHEN 325 MG PO TABS
650.0000 mg | ORAL_TABLET | Freq: Four times a day (QID) | ORAL | Status: DC | PRN
  Administered 2019-10-05 – 2019-10-06 (×2): 650 mg via ORAL
  Filled 2019-10-05 (×2): qty 2

## 2019-10-05 MED ORDER — DEXAMETHASONE SODIUM PHOSPHATE 10 MG/ML IJ SOLN
6.0000 mg | Freq: Once | INTRAMUSCULAR | Status: AC
  Administered 2019-10-05: 6 mg via INTRAVENOUS
  Filled 2019-10-05: qty 1

## 2019-10-05 MED ORDER — ACETAMINOPHEN 325 MG PO TABS
650.0000 mg | ORAL_TABLET | Freq: Once | ORAL | Status: AC | PRN
  Administered 2019-10-05: 650 mg via ORAL
  Filled 2019-10-05: qty 2

## 2019-10-05 MED ORDER — DEXAMETHASONE SODIUM PHOSPHATE 10 MG/ML IJ SOLN
6.0000 mg | INTRAMUSCULAR | Status: DC
  Administered 2019-10-06: 6 mg via INTRAVENOUS
  Filled 2019-10-05: qty 1

## 2019-10-05 MED ORDER — ALBUTEROL SULFATE HFA 108 (90 BASE) MCG/ACT IN AERS
2.0000 | INHALATION_SPRAY | Freq: Four times a day (QID) | RESPIRATORY_TRACT | Status: DC
  Administered 2019-10-05 – 2019-10-08 (×12): 2 via RESPIRATORY_TRACT
  Filled 2019-10-05 (×2): qty 6.7

## 2019-10-05 MED ORDER — HYDRALAZINE HCL 20 MG/ML IJ SOLN: 5.0000 mg | INTRAMUSCULAR | Status: DC | PRN

## 2019-10-05 MED ORDER — SODIUM CHLORIDE 0.9 % IV SOLN
100.0000 mg | Freq: Every day | INTRAVENOUS | Status: DC
  Administered 2019-10-06 – 2019-10-08 (×3): 100 mg via INTRAVENOUS
  Filled 2019-10-05 (×4): qty 20

## 2019-10-05 MED ORDER — ZINC SULFATE 220 (50 ZN) MG PO CAPS
220.0000 mg | ORAL_CAPSULE | Freq: Every day | ORAL | Status: DC
  Administered 2019-10-05 – 2019-10-08 (×4): 220 mg via ORAL
  Filled 2019-10-05 (×4): qty 1

## 2019-10-05 MED ORDER — SODIUM CHLORIDE 0.9 % IV SOLN
200.0000 mg | Freq: Once | INTRAVENOUS | Status: AC
  Administered 2019-10-05: 200 mg via INTRAVENOUS
  Filled 2019-10-05: qty 200

## 2019-10-05 MED ORDER — ENOXAPARIN SODIUM 40 MG/0.4ML ~~LOC~~ SOLN
40.0000 mg | SUBCUTANEOUS | Status: DC
  Administered 2019-10-05 – 2019-10-06 (×2): 40 mg via SUBCUTANEOUS
  Filled 2019-10-05 (×2): qty 0.4

## 2019-10-05 MED ORDER — HYDROCOD POLST-CPM POLST ER 10-8 MG/5ML PO SUER: 5.0000 mL | Freq: Two times a day (BID) | ORAL | Status: DC | PRN

## 2019-10-05 MED ORDER — GUAIFENESIN-DM 100-10 MG/5ML PO SYRP
10.0000 mL | ORAL_SOLUTION | ORAL | Status: DC | PRN
  Administered 2019-10-07 (×2): 10 mL via ORAL
  Filled 2019-10-05 (×2): qty 10

## 2019-10-05 MED ORDER — ACETAMINOPHEN 650 MG RE SUPP: 650.0000 mg | Freq: Four times a day (QID) | RECTAL | Status: DC | PRN

## 2019-10-05 NOTE — ED Triage Notes (Signed)
Per EMS-states productive cough, fever, SOB-placed on 2L due to low spO2-has not been around anyone with covid-unvaccinated-was here last night but did not stay-symptoms started on Sunday-taking OTC meds

## 2019-10-05 NOTE — ED Provider Notes (Signed)
[facility]-EMERGENCY DEPT Provider Note   CSN: [id number] Arrival date & time: 10/05/19  [id number]     History Chief Complaint  Patient presents with  . Shortness of Breath    Jay Gonzalez is a 54 y.o. male with a history of hypertension, lipidemia, sleep apnea, and obesity who presents to the emergency department with complaints of shortness of breath x 6 days. Patient reports he has had progressively worsening dyspnea that is constant, aggravated by activity, and without alleviating factors. He has had associated subjective fever, chills, body aches, diarrhea, and cough productive of yellow phlegm sputum. He states he has some intermittent chest discomfort that is worse with coughing and deep breathing. He has not been vaccinated against COVID-19. He denies syncope, dizziness, vomiting, leg pain/swelling, hemoptysis, recent surgery/trauma, recent long travel, hormone use, personal hx of cancer, or hx of DVT/PE.   HPI     Past Medical History:  Diagnosis Date  . Hypertension   . Morbid obesity (HCC)   . Sleep apnea     Patient Active Problem List   Diagnosis Date Noted  . Essential hypertension 04/24/2015  . H/O laparoscopic adjustable gastric banding 04/24/2015  . Sleep apnea with use of continuous positive airway pressure (CPAP) 04/24/2015  . DYSLIPIDEMIA 05/12/2007  . OBESITY 05/12/2007  . FOOT PAIN, CHRONIC 05/12/2007    Past Surgical History:  Procedure Laterality Date  . ADENOIDECTOMY    . COLONOSCOPY WITH PROPOFOL N/A 03/17/2016   Procedure: COLONOSCOPY WITH PROPOFOL;  Surgeon: Willis ModenaWilliam Outlaw, MD;  Location: [facility];  Service: Endoscopy;  Laterality: N/A;  . EYE SURGERY  1971, 1981  . EYE SURGERY    . LAPAROSCOPIC GASTRIC BANDING  10/19/2011   Procedure: LAPAROSCOPIC GASTRIC BANDING;  Surgeon: Lodema PilotBrian Layton, DO;  Location: [facility];  Service: General;  Laterality: N/A;       Family History  Problem Relation Age of Onset  . Cancer Paternal  Aunt   . Cancer Paternal Aunt   . Cancer Paternal Aunt     Social History   Tobacco Use  . Smoking status: Never Smoker  . Smokeless tobacco: Never Used  Substance Use Topics  . Alcohol use: Yes    Comment: maybe 2 beers per week or less  . Drug use: No    Home Medications Prior to Admission medications   Medication Sig Start Date End Date Taking? Authorizing Provider  Aspirin-Salicylamide-Caffeine (BC HEADACHE POWDER PO) Take 1-2 packets by mouth daily as needed (back pain).     [provider]  calcium carbonate (TUMS - DOSED IN MG ELEMENTAL CALCIUM) 500 MG chewable tablet Chew 3 tablets by mouth daily as needed for indigestion or heartburn.    [provider]  diclofenac (VOLTAREN) 75 MG EC tablet Take 1 tablet (75 mg total) by mouth 2 (two) times daily. 06/23/17   Lenn Sinkegal, Norman S, DPM  Doxylamine-Phenylephrine-APAP (VICKS SINEX DAYQUIL/NYQUIL PO) Take 1 Dose by mouth every 6 (six) hours as needed (cold symptoms).    [provider]  lisinopril-hydrochlorothiazide (PRINZIDE,ZESTORETIC) 20-25 MG tablet TK 1 T PO QD FOR BP 06/15/17   [provider]  polyethylene glycol powder (GLYCOLAX) powder Take 17 g by mouth daily. Repeat every 6 to 8 hours as needed until loose stools Patient taking differently: Take 17 g by mouth daily as needed for mild constipation.  11/14/15   Sudie GrumblingAmyot, Ann Berry, NP    Allergies    Patient has no known allergies.  Review of Systems  Review of Systems  Constitutional: Positive for chills and fever.  HENT: Positive for congestion. Negative for ear pain and sore throat.   Respiratory: Positive for cough and shortness of breath.   Cardiovascular: Positive for chest pain. Negative for leg swelling.  Gastrointestinal: Positive for diarrhea. Negative for abdominal pain, blood in stool, constipation and vomiting.  Genitourinary: Negative for dysuria.  Neurological: Negative for syncope.  All other systems reviewed and are  negative.  Physical Exam Updated Vital Signs BP 130/71 (BP Location: Left Arm)   Pulse 96   Temp 99.4 F (37.4 C) (Oral)   Resp 16   SpO2 92%   Physical Exam Vitals and nursing note reviewed.  Constitutional:      General: He is not in acute distress.    Appearance: He is well-developed. He is not toxic-appearing.  HENT:     Head: Normocephalic and atraumatic.     Right Ear: Ear canal normal. Tympanic membrane is not perforated, erythematous, retracted or bulging.     Left Ear: Ear canal normal. Tympanic membrane is not perforated, erythematous, retracted or bulging.     Ears:     Comments: No mastoid erythema/swellng/tenderness.     Nose:     Right Sinus: No maxillary sinus tenderness or frontal sinus tenderness.     Left Sinus: No maxillary sinus tenderness or frontal sinus tenderness.     Mouth/Throat:     Pharynx: Oropharynx is clear. Uvula midline. No oropharyngeal exudate or posterior oropharyngeal erythema.     Comments: Posterior oropharynx is symmetric appearing. Patient tolerating own secretions without difficulty. No trismus. No drooling. No hot potato voice. No swelling beneath the tongue, submandibular compartment is soft.  Eyes:     General:        Right eye: No discharge.        Left eye: No discharge.     Conjunctiva/sclera: Conjunctivae normal.  Cardiovascular:     Rate and Rhythm: Normal rate and regular rhythm.  Pulmonary:     Effort: Tachypnea present. No respiratory distress.     Breath sounds: Rhonchi (bibasilar) present. No wheezing or rales.     Comments: Mildly tachypneic, SpO2 upper 80s on RA, requiring 2L via [place] to maintain SpO2.  Abdominal:     General: There is no distension.     Palpations: Abdomen is soft.     Tenderness: There is no abdominal tenderness.  Musculoskeletal:     Cervical back: Neck supple. No rigidity.     Right lower leg: No tenderness. No edema.     Left lower leg: No tenderness. No edema.  Lymphadenopathy:     Cervical:  No cervical adenopathy.  Skin:    General: Skin is warm and dry.     Findings: No rash.  Neurological:     Mental Status: He is alert.  Psychiatric:        Behavior: Behavior normal.     ED Results / Procedures / Treatments   Labs (all labs ordered are listed, but only abnormal results are displayed) Labs Reviewed  SARS CORONAVIRUS 2 BY RT PCR (HOSPITAL ORDER, PERFORMED IN  HOSPITAL LAB) - Abnormal; Notable for the following components:      Result Value   SARS Coronavirus 2 POSITIVE (*)    All other components within normal limits  COMPREHENSIVE METABOLIC PANEL - Abnormal; Notable for the following components:   Sodium 132 (*)    Chloride 97 (*)    Glucose, Bld 114 (*)  BUN 24 (*)    Creatinine, Ser 1.48 (*)    Calcium 8.2 (*)    AST 52 (*)    GFR calc non Af Amer 54 (*)    All other components within normal limits  D-DIMER, QUANTITATIVE (NOT AT [facility]) - Abnormal; Notable for the following components:   D-Dimer, Quant 0.76 (*)    All other components within normal limits  LACTATE DEHYDROGENASE - Abnormal; Notable for the following components:   LDH 336 (*)    All other components within normal limits  FERRITIN - Abnormal; Notable for the following components:   Ferritin 1,107 (*)    All other components within normal limits  FIBRINOGEN - Abnormal; Notable for the following components:   Fibrinogen 504 (*)    All other components within normal limits  C-REACTIVE PROTEIN - Abnormal; Notable for the following components:   CRP 10.6 (*)    All other components within normal limits  CULTURE, BLOOD (ROUTINE X 2)  CULTURE, BLOOD (ROUTINE X 2)  LACTIC ACID, PLASMA  CBC WITH DIFFERENTIAL/PLATELET  PROCALCITONIN  TRIGLYCERIDES  LACTIC ACID, PLASMA  TROPONIN I (HIGH SENSITIVITY)  TROPONIN I (HIGH SENSITIVITY)    EKG None  Radiology DG Chest 2 View  Result Date: 10/04/2019 CLINICAL DATA:  Shortness of breath, cough, fever EXAM: CHEST - 2 VIEW COMPARISON:   05/27/2019 FINDINGS: Heart is normal size. Patchy bilateral airspace opacities. No effusions. No acute bony abnormality. IMPRESSION: Patchy bilateral airspace disease concerning for pneumonia. Electronically Signed   By: Charlett Nose M.D.   On: 10/04/2019 22:12    Procedures .Critical Care Performed by: Cherly Anderson, PA-C Authorized by: Cherly Anderson, PA-C    CRITICAL CARE Performed by: Harvie Heck   Total critical care time: 35 minutes  Critical care time was exclusive of separately billable procedures and treating other patients.  Critical care was necessary to treat or prevent imminent or life-threatening deterioration.  Critical care was time spent personally by me on the following activities: development of treatment plan with patient and/or surrogate as well as nursing, discussions with consultants, evaluation of patient's response to treatment, examination of patient, obtaining history from patient or surrogate, ordering and performing treatments and interventions, ordering and review of laboratory studies, ordering and review of radiographic studies, pulse oximetry and re-evaluation of patient's condition.   (including critical care time)  Medications Ordered in ED Medications  dexamethasone (DECADRON) injection 6 mg (has no administration in time range)  acetaminophen (TYLENOL) tablet 650 mg (650 mg Oral Given 10/05/19 0939)    ED Course  I have reviewed the triage vital signs and the nursing notes.  Pertinent labs & imaging results that were available during my care of the patient were reviewed by me and considered in my medical decision making (see chart for details).  Jay Gonzalez was evaluated in Emergency Department on 10/05/2019 for the symptoms described in the history of present illness. He/she was evaluated in the context of the global COVID-19 pandemic, which necessitated consideration that the patient might be at risk for infection  with the SARS-CoV-2 virus that causes COVID-19. Institutional protocols and algorithms that pertain to the evaluation of patients at risk for COVID-19 are in a state of rapid change based on information released by regulatory bodies including the CDC and federal and state organizations. These policies and algorithms were followed during the patient's care in the ED.    MDM Rules/Calculators/A&P  Patient presents to the ED with complaints of  Dyspnea x 6 days. Nontoxic, vitals with hypoxia requiring 2L via [place], mild tachypnea, otherwise WNL. Lungs with scattered rhonchi, exam otherwise benign.  Ddx: COVID 19, community acquired pneumonia, pulmonary embolism, ACS, pneumothorax, CHF, bronchitis.   Additional history obtained:  Additional history obtained from chart review & nursing note review. Previous records obtained and reviewed.   Lab Tests:  I Ordered, reviewed, and interpreted labs, which included:  CBC: Grossly unremarkable CMP: Mild AKI and mild electrolyte abnormalities as above. LDH, ferritin, D-dimer, CRP, and fibrinogen are elevated. Triglycerides: Within normal limits Lactic acid: Within normal limits Troponin: Within normal Procalcitonin: Within normal limits Covid testing is positive.  Imaging Studies ordered:  Chest x-ray ordered per triage, I independently visualized and interpreted imaging which showed Patchy bilateral airspace disease concerning for pneumonia.   Patient is Covid positive with acute hypoxic respiratory failure requiring 2 L to maintain SPO2, will start Decadron and remdesivir with discussion with hospitalist service for admission.   13:58: CONSULT: Discussed with hospitalist Dr. Rhona Leavens- accepts admission.   Portions of this note were generated with Scientist, clinical (histocompatibility and immunogenetics). Dictation errors may occur despite best attempts at proofreading.  Final Clinical Impression(s) / ED Diagnoses Final diagnoses:  COVID-19  Acute hypoxemic  respiratory failure [facility])    Rx / DC Orders ED Discharge Orders    None       Cherly Anderson, PA-C 10/05/19 1422    Tegeler, Canary Brim, MD 10/05/19 1430

## 2019-10-05 NOTE — H&P (Signed)
History and Physical    Jay Gonzalez ZOX:[id number] DOB: 22-Apr-1965 DOA: 10/05/2019  PCP: Blair Heys, MD  Patient coming from: Home  Chief Complaint: SOB  HPI: Jay Gonzalez is a 54 y.o. male with medical history significant of PCD, hypertension, obesity who presented with increased sob for the past week leading up to admission.  Patient reports subjective fevers, chills, myalgias, diarrhea with cough.  Patient denies nausea vomiting or chest pain. Of note, patient is not vaccinated against COVID-19 because "I'm hardheaded." No known sick contacts  ED Course: In the emergency department, patient was noted to have chest x-ray findings consistent with bilateral pneumonia.  Patient did positive for COVID-19.  Patient's inflammatory markers were noted to be elevated with a CRP in excess of 10.  Patient was given 1 dose of IV Decadron and started on a course of remdesivir in the ED.  Hospitalist service consulted for consideration for medical admission  Review of Systems:  Review of Systems  Constitutional: Positive for fever and malaise/fatigue.  HENT: Negative for congestion, ear discharge and sinus pain.   Eyes: Negative for double vision, photophobia and discharge.  Respiratory: Positive for cough, sputum production and shortness of breath.   Cardiovascular: Negative for orthopnea and claudication.  Gastrointestinal: Negative for constipation, nausea and vomiting.  Genitourinary: Negative for frequency, hematuria and urgency.  Musculoskeletal: Positive for myalgias. Negative for back pain, falls and joint pain.  Neurological: Negative for tremors, sensory change, seizures and loss of consciousness.  Psychiatric/Behavioral: Negative for hallucinations and memory loss. The patient is not nervous/anxious.     Past Medical History:  Diagnosis Date  . Hypertension   . Morbid obesity (HCC)   . Sleep apnea     Past Surgical History:  Procedure Laterality Date  . ADENOIDECTOMY      . COLONOSCOPY WITH PROPOFOL N/A 03/17/2016   Procedure: COLONOSCOPY WITH PROPOFOL;  Surgeon: Willis Modena, MD;  Location: [facility];  Service: Endoscopy;  Laterality: N/A;  . EYE SURGERY  1971, 1981  . EYE SURGERY    . LAPAROSCOPIC GASTRIC BANDING  10/19/2011   Procedure: LAPAROSCOPIC GASTRIC BANDING;  Surgeon: Lodema Pilot, DO;  Location: [facility];  Service: General;  Laterality: N/A;     reports that he has never smoked. He has never used smokeless tobacco. He reports current alcohol use. He reports that he does not use drugs.  No Known Allergies  Family History  Problem Relation Age of Onset  . Cancer Paternal Aunt   . Cancer Paternal Aunt   . Cancer Paternal Aunt     Prior to Admission medications   Medication Sig Start Date End Date Taking? Authorizing Provider  Aspirin-Salicylamide-Caffeine (BC HEADACHE POWDER PO) Take 1-2 packets by mouth daily as needed (back pain).    Yes [provider]  calcium carbonate (TUMS - DOSED IN MG ELEMENTAL CALCIUM) 500 MG chewable tablet Chew 3 tablets by mouth daily as needed for indigestion or heartburn.   Yes [provider]  lisinopril-hydrochlorothiazide (PRINZIDE,ZESTORETIC) 20-25 MG tablet Take 1 tablet by mouth daily.  06/15/17  Yes [provider]  polyethylene glycol powder (GLYCOLAX) powder Take 17 g by mouth daily. Repeat every 6 to 8 hours as needed until loose stools Patient taking differently: Take 17 g by mouth daily as needed for mild constipation.  11/14/15  Yes Amyot, Ali Lowe, NP  diclofenac (VOLTAREN) 75 MG EC tablet Take 1 tablet (75 mg total) by mouth 2 (two) times daily. 06/23/17   Regal,  Kirstie Peri, DPM    Physical Exam: Vitals:   10/05/19 1125 10/05/19 1127 10/05/19 1130 10/05/19 1351  BP: 130/71   120/73  Pulse: 96   92  Resp: 16   19  Temp: 99.4 F (37.4 C)     TempSrc: Oral     SpO2: (!) 88% (!) 89% 92% 97%    Constitutional: NAD, calm, comfortable Vitals:   10/05/19 1125 10/05/19  1127 10/05/19 1130 10/05/19 1351  BP: 130/71   120/73  Pulse: 96   92  Resp: 16   19  Temp: 99.4 F (37.4 C)     TempSrc: Oral     SpO2: (!) 88% (!) 89% 92% 97%   Eyes: PERRL, lids and conjunctivae normal ENMT: head nontraumatic, external ears normal Neck: normal, supple, trachea midline Respiratory: actively coughing, coarse Cardiovascular: perfused, no JVD appreciated Abdomen: obese, nondistended Musculoskeletal: no clubbing / cyanosis. No joint deformity upper and lower extremities. Good ROM, no contractures. Normal muscle tone.  Skin: no rashes, lesions, Neurologic: CN 2-12 grossly intact. Sensation intact, no tremors Psychiatric: Normal judgment and insight. Alert and oriented x 3. Normal mood.    Labs on Admission: I have personally reviewed following labs and imaging studies  CBC: Recent Labs  Lab 10/05/19 1220  WBC 7.8  NEUTROABS 6.6  HGB 16.3  HCT 48.2  MCV 85.5  PLT 162   Basic Metabolic Panel: Recent Labs  Lab 10/05/19 1220  NA 132*  K 4.2  CL 97*  CO2 23  GLUCOSE 114*  BUN 24*  CREATININE 1.48*  CALCIUM 8.2*   GFR: CrCl cannot be calculated (Unknown ideal weight.). Liver Function Tests: Recent Labs  Lab 10/05/19 1220  AST 52*  ALT 39  ALKPHOS 76  BILITOT 0.6  PROT 8.1  ALBUMIN 3.9   No results for input(s): LIPASE, AMYLASE in the last 168 hours. No results for input(s): AMMONIA in the last 168 hours. Coagulation Profile: No results for input(s): INR, PROTIME in the last 168 hours. Cardiac Enzymes: No results for input(s): CKTOTAL, CKMB, CKMBINDEX, TROPONINI in the last 168 hours. BNP (last 3 results) No results for input(s): PROBNP in the last 8760 hours. HbA1C: No results for input(s): HGBA1C in the last 72 hours. CBG: No results for input(s): GLUCAP in the last 168 hours. Lipid Profile: Recent Labs    10/05/19 1220  TRIG 77   Thyroid Function Tests: No results for input(s): TSH, T4TOTAL, FREET4, T3FREE, THYROIDAB in the last  72 hours. Anemia Panel: Recent Labs    10/05/19 1220  FERRITIN 1,107*   Urine analysis: No results found for: COLORURINE, APPEARANCEUR, LABSPEC, PHURINE, GLUCOSEU, HGBUR, BILIRUBINUR, KETONESUR, PROTEINUR, UROBILINOGEN, NITRITE, LEUKOCYTESUR Sepsis Labs: !!!!!!!!!!!!!!!!!!!!!!!!!!!!!!!!!!!!!!!!!!!! @LABRCNTIP (procalcitonin:4,lacticidven:4) ) Recent Results (from the past 240 hour(s))  SARS Coronavirus 2 by RT PCR (hospital order, performed in [facility] hospital lab) Nasopharyngeal Nasopharyngeal Swab     Status: Abnormal   Collection Time: 10/05/19 12:01 PM   Specimen: Nasopharyngeal Swab  Result Value Ref Range Status   SARS Coronavirus 2 POSITIVE (A) NEGATIVE Final    Comment: CRITICAL RESULT CALLED TO, READ BACK BY AND VERIFIED WITH: BALDWIN,L. RN @1343  10/05/19 BILLINGSLEY,L (NOTE) SARS-CoV-2 target nucleic acids are DETECTED  SARS-CoV-2 RNA is generally detectable in upper respiratory specimens  during the acute phase of infection.  Positive results are indicative  of the presence of the identified virus, but do not rule out bacterial infection or co-infection with other pathogens not detected by the test.  Clinical correlation with  patient history and  other diagnostic information is necessary to determine patient infection status.  The expected result is negative.  Fact Sheet for Patients:   BoilerBrush.com.cy   Fact Sheet for Healthcare Providers:   https://pope.com/    This test is not yet approved or cleared by the [place] FDA and  has been authorized for detection and/or diagnosis of SARS-CoV-2 by FDA under an Emergency Use Authorization (EUA).  This EUA will remain in effect  (meaning this test can be used) for the duration of  the COVID-19 declaration under Section 564(b)(1) of the Act, 21 U.S.C. section 360-bbb-3(b)(1), unless the authorization is terminated or revoked sooner.  Performed at [facility], [address] [place], [place] [address]      Radiological Exams on Admission: DG Chest 2 View  Result Date: 10/05/2019 CLINICAL DATA:  Productive cough, fever, shortness of breath EXAM: CHEST - 2 VIEW COMPARISON:  10/04/2019 FINDINGS: Normal heart size, mediastinal contours, and pulmonary vascularity. Hazy infiltrates consistent with pneumonia unchanged. No pleural effusion or pneumothorax. IMPRESSION: Persistent hazy infiltrates bilaterally consistent with pneumonia. Electronically Signed   By: Ulyses Southward M.D.   On: 10/05/2019 11:24   DG Chest 2 View  Result Date: 10/04/2019 CLINICAL DATA:  Shortness of breath, cough, fever EXAM: CHEST - 2 VIEW COMPARISON:  05/27/2019 FINDINGS: Heart is normal size. Patchy bilateral airspace opacities. No effusions. No acute bony abnormality. IMPRESSION: Patchy bilateral airspace disease concerning for pneumonia. Electronically Signed   By: Charlett Nose M.D.   On: 10/04/2019 22:12    EKG: Independently reviewed. Sinus, QTc 440  Assessment/Plan Principal Problem:   Pneumonia due to COVID-19 virus Active Problems:   OBESITY   Essential hypertension   Sleep apnea with use of continuous positive airway pressure (CPAP)   1. Pneumonia secondary to COVID-19 1. Patient is unvaccinated 2. Testing confirms Covid pneumonia 3. Reviewed, findings of bilateral infiltrates suggestive of pneumonia 4. Inflammatory markers are elevated with CRP in excess of 10 5. Resented hypoxemic with O2 saturation into the 80s on room air, improved with 2 L nasal cannula.  Would continue to wean as tolerated 6. We will start patient on IV steroids, remdesivir 7. We will continue patient with vitamin C and zinc 8. Follow serial inflammatory markers, CBC, CMP 2. Hypertension 1. Blood pressure presently stable 2. Would hold home ACE inhibitor in light of acute renal failure per below 3. Acute renal failure 1. Patient with baseline normal creatinine less  than 1, presents with creatinine over 1.4 2. Hold ACE inhibitor 3. Given presenting Covid pneumonia, will allow patient to self hydrate 4. Repeat basic metabolic panel in the morning 4. Obesity and obstructive sleep apnea on CPAP at home 1. Given active Covid pneumonia, would not provide CPAP while in hospital  DVT prophylaxis: Lovenox subq  Code Status: Full Family Communication: Pt in room  Disposition Plan:   Consults called:  Admission status: Patient has will likely require greater than 2 midnight stay to treat active Covid pneumonia  Rickey Barbara MD [facility] Pager On Amion  If 7PM-7AM, please contact night-coverage  10/05/2019, 2:08 PM

## 2019-10-06 DIAGNOSIS — J9601 Acute respiratory failure with hypoxia: Secondary | ICD-10-CM

## 2019-10-06 DIAGNOSIS — U071 COVID-19: Secondary | ICD-10-CM

## 2019-10-06 HISTORY — DX: COVID-19: U07.1

## 2019-10-06 LAB — CBC WITH DIFFERENTIAL/PLATELET
Abs Immature Granulocytes: 0.04 10*3/uL (ref 0.00–0.07)
Basophils Absolute: 0 10*3/uL (ref 0.0–0.1)
Basophils Relative: 0 %
Eosinophils Absolute: 0 10*3/uL (ref 0.0–0.5)
Eosinophils Relative: 0 %
HCT: 47.3 % (ref 39.0–52.0)
Hemoglobin: 15.8 g/dL (ref 13.0–17.0)
Immature Granulocytes: 1 %
Lymphocytes Relative: 14 %
Lymphs Abs: 1 10*3/uL (ref 0.7–4.0)
MCH: 28.7 pg (ref 26.0–34.0)
MCHC: 33.4 g/dL (ref 30.0–36.0)
MCV: 86 fL (ref 80.0–100.0)
Monocytes Absolute: 0.5 10*3/uL (ref 0.1–1.0)
Monocytes Relative: 7 %
Neutro Abs: 5.6 10*3/uL (ref 1.7–7.7)
Neutrophils Relative %: 78 %
Platelets: 172 10*3/uL (ref 150–400)
RBC: 5.5 MIL/uL (ref 4.22–5.81)
RDW: 14.5 % (ref 11.5–15.5)
WBC: 7.2 10*3/uL (ref 4.0–10.5)
nRBC: 0 % (ref 0.0–0.2)

## 2019-10-06 LAB — COMPREHENSIVE METABOLIC PANEL
ALT: 39 U/L (ref 0–44)
AST: 60 U/L — ABNORMAL HIGH (ref 15–41)
Albumin: 3.4 g/dL — ABNORMAL LOW (ref 3.5–5.0)
Alkaline Phosphatase: 69 U/L (ref 38–126)
Anion gap: 9 (ref 5–15)
BUN: 20 mg/dL (ref 6–20)
CO2: 27 mmol/L (ref 22–32)
Calcium: 8.5 mg/dL — ABNORMAL LOW (ref 8.9–10.3)
Chloride: 99 mmol/L (ref 98–111)
Creatinine, Ser: 1.04 mg/dL (ref 0.61–1.24)
GFR calc Af Amer: >60 mL/min (ref >60)
GFR calc non Af Amer: >60 mL/min (ref >60)
Glucose, Bld: 106 mg/dL — ABNORMAL HIGH (ref 70–99)
Potassium: 4.4 mmol/L (ref 3.5–5.1)
Sodium: 135 mmol/L (ref 135–145)
Total Bilirubin: 0.8 mg/dL (ref 0.3–1.2)
Total Protein: 7.3 g/dL (ref 6.5–8.1)

## 2019-10-06 LAB — BRAIN NATRIURETIC PEPTIDE: B Natriuretic Peptide: 38.6 pg/mL (ref 0.0–100.0)

## 2019-10-06 LAB — HIV ANTIBODY (ROUTINE TESTING W REFLEX): HIV Screen 4th Generation wRfx: NONREACTIVE

## 2019-10-06 LAB — FERRITIN: Ferritin: 1187 ng/mL — ABNORMAL HIGH (ref 24–336)

## 2019-10-06 LAB — C-REACTIVE PROTEIN: CRP: 12.6 mg/dL — ABNORMAL HIGH (ref <1.0)

## 2019-10-06 LAB — D-DIMER, QUANTITATIVE: D-Dimer, Quant: 0.78 ug/mL-FEU — ABNORMAL HIGH (ref 0.00–0.50)

## 2019-10-06 MED ORDER — METHYLPREDNISOLONE SODIUM SUCC 125 MG IJ SOLR
60.0000 mg | Freq: Two times a day (BID) | INTRAMUSCULAR | Status: DC
  Administered 2019-10-06 – 2019-10-08 (×5): 60 mg via INTRAVENOUS
  Filled 2019-10-06 (×5): qty 2

## 2019-10-06 MED ORDER — BARICITINIB 2 MG PO TABS
4.0000 mg | ORAL_TABLET | Freq: Every day | ORAL | Status: DC
  Administered 2019-10-06 – 2019-10-08 (×3): 4 mg via ORAL
  Filled 2019-10-06 (×3): qty 2

## 2019-10-06 NOTE — Progress Notes (Signed)
Pt stable on arrival to floor. No needs on arrival. Pt denied pain. Pt medicated for fever. Pt also remains respiratory rate also remains elevated at 25.

## 2019-10-06 NOTE — ED Notes (Signed)
Report given to floor RN

## 2019-10-06 NOTE — Progress Notes (Signed)
[facility]                                                                              Patient Demographics  Jay Gonzalez, is a 54 y.o. male, DOB - 1965-12-05, [id number]  Admit date - 10/05/2019   Admitting Physician Jerald Kief, MD  Outpatient Primary MD for the patient is Blair Heys, MD  Outpatient specialists:   LOS - 1  days   Medical records reviewed and are as summarized below:    Chief Complaint  Patient presents with  . Shortness of Breath       Brief summary   Patient is a 54 year old male with history of hypertension, obesity, OSA on CPAP presented to ED with increased shortness of breath for the past week.  Patient ported subjective fevers and chills, myalgias, diarrhea with cough.  No nausea or vomiting or chest pain. Of note, patient is not vaccinated against COVID-19  In ED, patient was found to have COVID-19 positive, chest x-ray findings with bilateral pneumonia, inflammatory markers elevated.  Patient was given 1 dose of IV Decadron and started on remdesivir, placed on 2 L.     Assessment & Plan    Principal Problem:    Acute hypoxic respiratory failure due to acute COVID-19 viral pneumonia during the ongoing COVID-19 pandemic- POA - Patient presented with subjective fevers and chills, shortness of breath, myalgias with diarrhea.  Chest x-ray showed hazy infiltrates bilaterally consistent with pneumonia - Currently hypoxic, requiring 6 L nasal cannula - Continue current management: Remdesivir, day #2, escalating needs for O2 (at the time of my examination on 2 L, now on 6 L), will start on baricitinib, IV Solu-Medrol 60 mg every 12hrs  -  Continue Supportive care: vitamin C/zinc, albuterol, Tylenol. - Continue to wean oxygen, ambulatory O2 screening daily as tolerated  - Oxygen - SpO2: 93 % O2 Flow Rate (L/min): 6 L/min - Continue to follow labs as below, CRP trending up, follow BNP  Lab Results  Component Value Date     SARSCOV2NAA POSITIVE (A) 10/05/2019     Recent Labs  Lab 10/05/19 1220 10/06/19 0620  DDIMER 0.76* 0.78*  FERRITIN 1,107* 1,187*  CRP 10.6* 12.6*  ALT 39 39  PROCALCITON 0.50  --      Active Problems:   Essential hypertension -BP currently stable, placed on IV hydralazine as needed with parameters  Acute kidney injury -Improving, creatinine 1.48 at the time of admission, improved to 1.0 today -Baseline 0.9-1.0  Obstructive sleep apnea with use of continuous positive airway pressure (CPAP) -Continue CPAP nightly (not contraindicated as patient has been on CPAP qhs at home)   Obesity Estimated body mass index is 46.63 kg/m as calculated from the following:   Height as of 06/23/17: 5\' 10"  (1.778 m).   Weight as of 06/23/17: 147.4 kg.  Code Status: Full CODE STATUS DVT Prophylaxis:  Lovenox  Family Communication: Discussed all imaging results, lab results, explained to the patient. Called patient's father on phone, unable to make contact   Disposition Plan:     Status is: Inpatient  Remains inpatient appropriate because:Inpatient level of care appropriate  due to severity of illness   Dispo: The patient is from: Home              Anticipated d/c is to: Home              Anticipated d/c date is: > 3 days              Patient currently is not medically stable to d/c.  Hypoxemia, escalating needs for O2, on 6 L now, receiving treatment for Covid pneumonia, acute respiratory failure      Time Spent in minutes   35 minutes  Procedures:  None  Consultants:   None  Antimicrobials:   Anti-infectives (From admission, onward)   Start     Dose/Rate Route Frequency Ordered Stop   10/06/19 1000  remdesivir 100 mg in sodium chloride 0.9 % 100 mL IVPB       "Followed by" Linked Group Details   100 mg 200 mL/hr over 30 Minutes Intravenous Daily 10/05/19 1357 10/10/19 0959   10/05/19 1500  remdesivir 200 mg in sodium chloride 0.9% 250 mL IVPB       "Followed by" Linked  Group Details   200 mg 580 mL/hr over 30 Minutes Intravenous Once 10/05/19 1357 10/05/19 1630          Medications  Scheduled Meds: . albuterol  2 puff Inhalation Q6H  . vitamin C  500 mg Oral Daily  . dexamethasone (DECADRON) injection  6 mg Intravenous Q24H  . enoxaparin (LOVENOX) injection  40 mg Subcutaneous Q24H  . metoprolol tartrate  25 mg Oral BID  . zinc sulfate  220 mg Oral Daily   Continuous Infusions: . remdesivir 100 mg in NS 100 mL 100 mg (10/06/19 0940)   PRN Meds:.acetaminophen **OR** acetaminophen, chlorpheniramine-HYDROcodone, guaiFENesin-dextromethorphan, hydrALAZINE      Subjective:   Jay Gonzalez was seen and examined today.  Still having shortness of breath, myalgias.  No fevers this morning.  Patient denies dizziness, abdominal pain, N/V/D/C, new weakness, numbess, tingling.  At the time of my examination on 2 L O2 Objective:   Vitals:   10/06/19 0929 10/06/19 0946 10/06/19 1030 10/06/19 1100  BP: (!) 147/90 (!) 165/90 137/89 140/86  Pulse: 88 96  94  Resp:  18 17 (!) 27  Temp:      TempSrc:      SpO2:  91%  93%   No intake or output data in the 24 hours ending 10/06/19 1149   Wt Readings from Last 3 Encounters:  06/23/17 (!) 147.4 kg  03/17/16 (!) 154.2 kg  03/26/15 (!) 154.2 kg     Exam  General: Alert and oriented x 3, NAD  Cardiovascular: S1 S2 auscultated, no murmurs, RRR  Respiratory: Decreased breath sound at the bases  Gastrointestinal: Obese, soft, nontender, nondistended, + bowel sounds  Ext: no pedal edema bilaterally  Neuro: no new deficits  Musculoskeletal: No digital cyanosis, clubbing  Skin: No rashes  Psych: Normal affect and demeanor, alert and oriented x3    Data Reviewed:  I have personally reviewed following labs and imaging studies  Micro Results Recent Results (from the past 240 hour(s))  Blood Culture (routine x 2)     Status: None (Preliminary result)   Collection Time: 10/05/19 11:38 AM    Specimen: BLOOD  Result Value Ref Range Status   Specimen Description   Final    BLOOD RIGHT ANTECUBITAL Performed at [facility], [address] [place], [place] [address]  Special Requests   Final    Blood Culture results may not be optimal due to an inadequate volume of blood received in culture bottles Performed at [facility], [address] [place], [place] [address]    Culture   Final    NO GROWTH < 24 HOURS Performed at [facility], [address] [place], [place] [address]    Report Status PENDING  Incomplete  SARS Coronavirus 2 by RT PCR (hospital order, performed in [facility] hospital lab) Nasopharyngeal Nasopharyngeal Swab     Status: Abnormal   Collection Time: 10/05/19 12:01 PM   Specimen: Nasopharyngeal Swab  Result Value Ref Range Status   SARS Coronavirus 2 POSITIVE (A) NEGATIVE Final    Comment: CRITICAL RESULT CALLED TO, READ BACK BY AND VERIFIED WITH: BALDWIN,L. RN @1343  10/05/19 BILLINGSLEY,L (NOTE) SARS-CoV-2 target nucleic acids are DETECTED  SARS-CoV-2 RNA is generally detectable in upper respiratory specimens  during the acute phase of infection.  Positive results are indicative  of the presence of the identified virus, but do not rule out bacterial infection or co-infection with other pathogens not detected by the test.  Clinical correlation with patient history and  other diagnostic information is necessary to determine patient infection status.  The expected result is negative.  Fact Sheet for Patients:   BoilerBrush.com.cyhttps://www.fda.gov/media/136312/download   Fact Sheet for Healthcare Providers:   https://pope.com/https://www.fda.gov/media/136313/download    This test is not yet approved or cleared by the [place] FDA and  has been authorized for detection and/or diagnosis of SARS-CoV-2 by FDA under an Emergency Use Authorization (EUA).  This EUA will remain in effect  (meaning this test can be used) for the duration  of  the COVID-19 declaration under Section 564(b)(1) of the Act, 21 U.S.C. section 360-bbb-3(b)(1), unless the authorization is terminated or revoked sooner.  Performed at [facility], [address] [place], [place] [address]   Blood Culture (routine x 2)     Status: None (Preliminary result)   Collection Time: 10/05/19 12:20 PM   Specimen: BLOOD  Result Value Ref Range Status   Specimen Description   Final    BLOOD LEFT ANTECUBITAL Performed at [facility], [address] [place], [place] [address]    Special Requests   Final    BOTTLES DRAWN AEROBIC AND ANAEROBIC Blood Culture adequate volume Performed at [facility], [address] [place], [place] [address]    Culture   Final    NO GROWTH < 24 HOURS Performed at [facility], [address] [place], [place] [address]    Report Status PENDING  Incomplete    Radiology Reports DG Chest 2 View  Result Date: 10/05/2019 CLINICAL DATA:  Productive cough, fever, shortness of breath EXAM: CHEST - 2 VIEW COMPARISON:  10/04/2019 FINDINGS: Normal heart size, mediastinal contours, and pulmonary vascularity. Hazy infiltrates consistent with pneumonia unchanged. No pleural effusion or pneumothorax. IMPRESSION: Persistent hazy infiltrates bilaterally consistent with pneumonia. Electronically Signed   By: Ulyses SouthwardMark  Boles M.D.   On: 10/05/2019 11:24   DG Chest 2 View  Result Date: 10/04/2019 CLINICAL DATA:  Shortness of breath, cough, fever EXAM: CHEST - 2 VIEW COMPARISON:  05/27/2019 FINDINGS: Heart is normal size. Patchy bilateral airspace opacities. No effusions. No acute bony abnormality. IMPRESSION: Patchy bilateral airspace disease concerning for pneumonia. Electronically Signed   By: Charlett NoseKevin  Dover M.D.   On: 10/04/2019 22:12    Lab Data:  CBC:  Recent Labs  Lab 10/05/19 1220 10/05/19 1858 10/06/19 0620  WBC 7.8 6.6 7.2  NEUTROABS 6.6  --  5.6  HGB 16.3 15.3 15.8    HCT 48.2 44.6 47.3  MCV 85.5 85.1 86.0  PLT 162 159 172   Basic Metabolic Panel: Recent Labs  Lab 10/05/19 1220 10/05/19 1858 10/06/19 0620  NA 132*  --  135  K 4.2  --  4.4  CL 97*  --  99  CO2 23  --  27  GLUCOSE 114*  --  106*  BUN 24*  --  20  CREATININE 1.48* 1.48* 1.04  CALCIUM 8.2*  --  8.5*   GFR: CrCl cannot be calculated (Unknown ideal weight.). Liver Function Tests: Recent Labs  Lab 10/05/19 1220 10/06/19 0620  AST 52* 60*  ALT 39 39  ALKPHOS 76 69  BILITOT 0.6 0.8  PROT 8.1 7.3  ALBUMIN 3.9 3.4*   No results for input(s): LIPASE, AMYLASE in the last 168 hours. No results for input(s): AMMONIA in the last 168 hours. Coagulation Profile: No results for input(s): INR, PROTIME in the last 168 hours. Cardiac Enzymes: No results for input(s): CKTOTAL, CKMB, CKMBINDEX, TROPONINI in the last 168 hours. BNP (last 3 results) No results for input(s): PROBNP in the last 8760 hours. HbA1C: No results for input(s): HGBA1C in the last 72 hours. CBG: No results for input(s): GLUCAP in the last 168 hours. Lipid Profile: Recent Labs    10/05/19 1220  TRIG 77   Thyroid Function Tests: No results for input(s): TSH, T4TOTAL, FREET4, T3FREE, THYROIDAB in the last 72 hours. Anemia Panel: Recent Labs    10/05/19 1220 10/06/19 0620  FERRITIN 1,107* 1,187*   Urine analysis: No results found for: COLORURINE, APPEARANCEUR, LABSPEC, PHURINE, GLUCOSEU, HGBUR, BILIRUBINUR, KETONESUR, PROTEINUR, UROBILINOGEN, NITRITE, LEUKOCYTESUR   Tamarion Haymond M.D. [facility] 10/06/2019, 11:49 AM   Call night coverage person covering after 7pm

## 2019-10-06 NOTE — Plan of Care (Signed)
  Problem: Clinical Measurements: Goal: Respiratory complications will improve Outcome: Progressing   Problem: Clinical Measurements: Goal: Cardiovascular complication will be avoided Outcome: Progressing   Problem: Nutrition: Goal: Adequate nutrition will be maintained Outcome: Progressing   Problem: Coping: Goal: Level of anxiety will decrease Outcome: Progressing   Problem: Pain Managment: Goal: General experience of comfort will improve Outcome: Progressing   

## 2019-10-07 LAB — CBC WITH DIFFERENTIAL/PLATELET
Abs Immature Granulocytes: 0.03 10*3/uL (ref 0.00–0.07)
Basophils Absolute: 0 10*3/uL (ref 0.0–0.1)
Basophils Relative: 0 %
Eosinophils Absolute: 0 10*3/uL (ref 0.0–0.5)
Eosinophils Relative: 0 %
HCT: 47.2 % (ref 39.0–52.0)
Hemoglobin: 15.8 g/dL (ref 13.0–17.0)
Immature Granulocytes: 0 %
Lymphocytes Relative: 15 %
Lymphs Abs: 1 10*3/uL (ref 0.7–4.0)
MCH: 28.8 pg (ref 26.0–34.0)
MCHC: 33.5 g/dL (ref 30.0–36.0)
MCV: 86 fL (ref 80.0–100.0)
Monocytes Absolute: 0.3 10*3/uL (ref 0.1–1.0)
Monocytes Relative: 4 %
Neutro Abs: 5.6 10*3/uL (ref 1.7–7.7)
Neutrophils Relative %: 81 %
Platelets: 213 10*3/uL (ref 150–400)
RBC: 5.49 MIL/uL (ref 4.22–5.81)
RDW: 14.5 % (ref 11.5–15.5)
WBC: 6.9 10*3/uL (ref 4.0–10.5)
nRBC: 0 % (ref 0.0–0.2)

## 2019-10-07 LAB — COMPREHENSIVE METABOLIC PANEL
ALT: 40 U/L (ref 0–44)
AST: 54 U/L — ABNORMAL HIGH (ref 15–41)
Albumin: 3.4 g/dL — ABNORMAL LOW (ref 3.5–5.0)
Alkaline Phosphatase: 68 U/L (ref 38–126)
Anion gap: 9 (ref 5–15)
BUN: 25 mg/dL — ABNORMAL HIGH (ref 6–20)
CO2: 25 mmol/L (ref 22–32)
Calcium: 8.5 mg/dL — ABNORMAL LOW (ref 8.9–10.3)
Chloride: 101 mmol/L (ref 98–111)
Creatinine, Ser: 1.02 mg/dL (ref 0.61–1.24)
GFR calc Af Amer: >60 mL/min (ref >60)
GFR calc non Af Amer: >60 mL/min (ref >60)
Glucose, Bld: 157 mg/dL — ABNORMAL HIGH (ref 70–99)
Potassium: 4.3 mmol/L (ref 3.5–5.1)
Sodium: 135 mmol/L (ref 135–145)
Total Bilirubin: 0.7 mg/dL (ref 0.3–1.2)
Total Protein: 7.5 g/dL (ref 6.5–8.1)

## 2019-10-07 LAB — FERRITIN: Ferritin: 1210 ng/mL — ABNORMAL HIGH (ref 24–336)

## 2019-10-07 LAB — D-DIMER, QUANTITATIVE: D-Dimer, Quant: 0.51 ug/mL-FEU — ABNORMAL HIGH (ref 0.00–0.50)

## 2019-10-07 LAB — C-REACTIVE PROTEIN: CRP: 6 mg/dL — ABNORMAL HIGH (ref <1.0)

## 2019-10-07 MED ORDER — ENOXAPARIN SODIUM 80 MG/0.8ML ~~LOC~~ SOLN
70.0000 mg | SUBCUTANEOUS | Status: DC
  Administered 2019-10-07: 70 mg via SUBCUTANEOUS
  Filled 2019-10-07 (×2): qty 0.8

## 2019-10-07 NOTE — Progress Notes (Signed)
[facility]                                                                              Patient Demographics  Jay Gonzalez, is a 54 y.o. male, DOB - Jan 30, 1966, [id number]  Admit date - 10/05/2019   Admitting Physician Jerald Kief, MD  Outpatient Primary MD for the patient is Blair Heys, MD  Outpatient specialists:   LOS - 2  days   Medical records reviewed and are as summarized below:    Chief Complaint  Patient presents with  . Shortness of Breath       Brief summary   Patient is a 54 year old male with history of hypertension, obesity, OSA on CPAP presented to ED with increased shortness of breath for the past week.  Patient ported subjective fevers and chills, myalgias, diarrhea with cough.  No nausea or vomiting or chest pain. Of note, patient is not vaccinated against COVID-19  In ED, patient was found to have COVID-19 positive, chest x-ray findings with bilateral pneumonia, inflammatory markers elevated.  Patient was given 1 dose of IV Decadron and started on remdesivir, placed on 2 L.     Assessment & Plan    Principal Problem:    Acute hypoxic respiratory failure due to acute COVID-19 viral pneumonia during the ongoing COVID-19 pandemic- POA - Patient presented with subjective fevers and chills, shortness of breath, myalgias with diarrhea.  Chest x-ray showed hazy infiltrates bilaterally consistent with pneumonia - Currently hypoxic, requiring 2 L nasal cannula (oxygenation improved from 6 L yesterday to 2 L today) - Continue current management: Remdesivir, day # 3, on baricitinib, IV Solu-Medrol 60 mg every 12hrs  -  Continue Supportive care: vitamin C/zinc, albuterol, Tylenol. - Continue to wean oxygen, ambulatory O2 screening daily as tolerated  - Oxygen - SpO2: 90 % O2 Flow Rate (L/min): 2 L/min - Continue to follow labs as below  Lab Results  Component Value Date   SARSCOV2NAA POSITIVE (A) 10/05/2019     Recent Labs    Lab 10/05/19 1220 10/06/19 0620 10/07/19 0656  DDIMER 0.76* 0.78* 0.51*  FERRITIN 1,107* 1,187* 1,210*  CRP 10.6* 12.6* 6.0*  ALT 39 39 40  PROCALCITON 0.50  --   --      Active Problems:   Essential hypertension -BP currently stable, continue IV hydralazine as needed with parameters  Acute kidney injury -Improving, creatinine 1.48 at the time of admission -Baseline 0.9-1.0 Creatinine improving, 1.0  Obstructive sleep apnea with use of continuous positive airway pressure (CPAP) -Continue CPAP nightly (not contraindicated as patient has been on CPAP qhs at home) Patient refused CPAP  Obesity Estimated body mass index is 46.03 kg/m as calculated from the following:   Height as of this encounter: 5\' 10"  (1.778 m).   Weight as of this encounter: 145.5 kg.  Code Status: Full CODE STATUS DVT Prophylaxis:  Lovenox  Family Communication: Discussed all imaging results, lab results, explained to the patient.   Disposition Plan:     Status is: Inpatient  Remains inpatient appropriate because:Inpatient level of care appropriate due to severity of illness   Dispo: The patient is  from: Home              Anticipated d/c is to: Home              Anticipated d/c date is: > 3 days              Patient currently is not medically stable to d/c.  Hopefully DC home in next 24 to 48 hours if continues to improve and off O2    Time Spent in minutes   25 minutes  Procedures:  None  Consultants:   None  Antimicrobials:   Anti-infectives (From admission, onward)   Start     Dose/Rate Route Frequency Ordered Stop   10/06/19 1000  remdesivir 100 mg in sodium chloride 0.9 % 100 mL IVPB       "Followed by" Linked Group Details   100 mg 200 mL/hr over 30 Minutes Intravenous Daily 10/05/19 1357 10/10/19 0959   10/05/19 1500  remdesivir 200 mg in sodium chloride 0.9% 250 mL IVPB       "Followed by" Linked Group Details   200 mg 580 mL/hr over 30 Minutes Intravenous Once 10/05/19  1357 10/05/19 1630         Medications  Scheduled Meds: . albuterol  2 puff Inhalation Q6H  . vitamin C  500 mg Oral Daily  . baricitinib  4 mg Oral Daily  . enoxaparin (LOVENOX) injection  40 mg Subcutaneous Q24H  . methylPREDNISolone (SOLU-MEDROL) injection  60 mg Intravenous Q12H  . metoprolol tartrate  25 mg Oral BID  . zinc sulfate  220 mg Oral Daily   Continuous Infusions: . remdesivir 100 mg in NS 100 mL 100 mg (10/07/19 0951)   PRN Meds:.acetaminophen **OR** acetaminophen, chlorpheniramine-HYDROcodone, guaiFENesin-dextromethorphan, hydrALAZINE      Subjective:   Jay Gonzalez was seen and examined today.  States feeling better today, no fevers or chills, sats still 89 to 90% on 2 L O2.  No fevers..  Patient denies dizziness, abdominal pain, N/V/D/C, new weakness, numbess, tingling   Objective:   Vitals:   10/06/19 1810 10/06/19 2041 10/07/19 0002 10/07/19 0406  BP: 123/80 127/85 (!) 126/96 (!) 130/101  Pulse: 75 69 64 75  Resp: 16 (!) 24 (!) 22 20  Temp: 99 F (37.2 C) 98.8 F (37.1 C) 97.9 F (36.6 C) 98.2 F (36.8 C)  TempSrc: Oral Oral Oral Oral  SpO2: 92% (!) 89% 90% 90%  Weight:      Height:        Intake/Output Summary (Last 24 hours) at 10/07/2019 1258 Last data filed at 10/06/2019 1831 Gross per 24 hour  Intake 600 ml  Output 600 ml  Net 0 ml     Wt Readings from Last 3 Encounters:  10/06/19 (!) 145.5 kg  06/23/17 (!) 147.4 kg  03/17/16 (!) 154.2 kg   Physical Exam  General: Alert and oriented x 3, NAD  Cardiovascular: S1 S2 clear, RRR. No pedal edema b/l  Respiratory: CTAB, no wheezing, rales or rhonchi  Gastrointestinal: Soft, nontender, nondistended, NBS  Ext: no pedal edema bilaterally  Neuro: no new deficits  Musculoskeletal: No cyanosis, clubbing  Skin: No rashes  Psych: Normal affect and demeanor, alert and oriented x3     Data Reviewed:  I have personally reviewed following labs and imaging studies  Micro  Results Recent Results (from the past 240 hour(s))  Blood Culture (routine x 2)     Status: None (Preliminary result)   Collection Time: 10/05/19 11:38  AM   Specimen: BLOOD  Result Value Ref Range Status   Specimen Description   Final    BLOOD RIGHT ANTECUBITAL Performed at [facility], [address] [place], [place] [address]    Special Requests   Final    Blood Culture results may not be optimal due to an inadequate volume of blood received in culture bottles Performed at [facility], [address], [place], [place] [address]    Culture   Final    NO GROWTH < 24 HOURS Performed at [facility], [address] [place], [place] [address]    Report Status PENDING  Incomplete  SARS Coronavirus 2 by RT PCR (hospital order, performed in [facility] hospital lab) Nasopharyngeal Nasopharyngeal Swab     Status: Abnormal   Collection Time: 10/05/19 12:01 PM   Specimen: Nasopharyngeal Swab  Result Value Ref Range Status   SARS Coronavirus 2 POSITIVE (A) NEGATIVE Final    Comment: CRITICAL RESULT CALLED TO, READ BACK BY AND VERIFIED WITH: BALDWIN,L. RN @1343  10/05/19 BILLINGSLEY,L (NOTE) SARS-CoV-2 target nucleic acids are DETECTED  SARS-CoV-2 RNA is generally detectable in upper respiratory specimens  during the acute phase of infection.  Positive results are indicative  of the presence of the identified virus, but do not rule out bacterial infection or co-infection with other pathogens not detected by the test.  Clinical correlation with patient history and  other diagnostic information is necessary to determine patient infection status.  The expected result is negative.  Fact Sheet for Patients:   10/07/19   Fact Sheet for Healthcare Providers:   BoilerBrush.com.cy    This test is not yet approved or cleared by the https://pope.com/ FDA and  has been authorized for detection  and/or diagnosis of SARS-CoV-2 by FDA under an Emergency Use Authorization (EUA).  This EUA will remain in effect  (meaning this test can be used) for the duration of  the COVID-19 declaration under Section 564(b)(1) of the Act, 21 U.S.C. section 360-bbb-3(b)(1), unless the authorization is terminated or revoked sooner.  Performed at [facility], [address], [place], [place]   Blood Culture (routine x 2)     Status: None (Preliminary result)   Collection Time: 10/05/19 12:20 PM   Specimen: BLOOD  Result Value Ref Range Status   Specimen Description   Final    BLOOD LEFT ANTECUBITAL Performed at [facility], [address], [place], [place]    Special Requests   Final    BOTTLES DRAWN AEROBIC AND ANAEROBIC Blood Culture adequate volume Performed at [facility], [address] [place], [place]    Culture   Final    NO GROWTH < 24 HOURS Performed at [facility], [address], [place], [place]    Report Status PENDING  Incomplete    Radiology Reports DG Chest 2 View  Result Date: 10/05/2019 CLINICAL DATA:  Productive cough, fever, shortness of breath EXAM: CHEST - 2 VIEW COMPARISON:  10/04/2019 FINDINGS: Normal heart size, mediastinal contours, and pulmonary vascularity. Hazy infiltrates consistent with pneumonia unchanged. No pleural effusion or pneumothorax. IMPRESSION: Persistent hazy infiltrates bilaterally consistent with pneumonia. Electronically Signed   By: 10/06/2019 M.D.   On: 10/05/2019 11:24   DG Chest 2 View  Result Date: 10/04/2019 CLINICAL DATA:  Shortness of breath, cough, fever EXAM: CHEST - 2 VIEW COMPARISON:  05/27/2019 FINDINGS: Heart is normal  size. Patchy bilateral airspace opacities. No effusions. No acute bony abnormality. IMPRESSION: Patchy bilateral airspace disease concerning for pneumonia. Electronically Signed   By: Charlett Nose M.D.   On:  10/04/2019 22:12    Lab Data:  CBC: Recent Labs  Lab 10/05/19 1220 10/05/19 1858 10/06/19 0620 10/07/19 0656  WBC 7.8 6.6 7.2 6.9  NEUTROABS 6.6  --  5.6 5.6  HGB 16.3 15.3 15.8 15.8  HCT 48.2 44.6 47.3 47.2  MCV 85.5 85.1 86.0 86.0  PLT 162 159 172 213   Basic Metabolic Panel: Recent Labs  Lab 10/05/19 1220 10/05/19 1858 10/06/19 0620 10/07/19 0656  NA 132*  --  135 135  K 4.2  --  4.4 4.3  CL 97*  --  99 101  CO2 23  --  27 25  GLUCOSE 114*  --  106* 157*  BUN 24*  --  20 25*  CREATININE 1.48* 1.48* 1.04 1.02  CALCIUM 8.2*  --  8.5* 8.5*   GFR: Estimated Creatinine Clearance: 122.2 mL/min (by C-G formula based on SCr of 1.02 mg/dL). Liver Function Tests: Recent Labs  Lab 10/05/19 1220 10/06/19 0620 10/07/19 0656  AST 52* 60* 54*  ALT 39 39 40  ALKPHOS 76 69 68  BILITOT 0.6 0.8 0.7  PROT 8.1 7.3 7.5  ALBUMIN 3.9 3.4* 3.4*   No results for input(s): LIPASE, AMYLASE in the last 168 hours. No results for input(s): AMMONIA in the last 168 hours. Coagulation Profile: No results for input(s): INR, PROTIME in the last 168 hours. Cardiac Enzymes: No results for input(s): CKTOTAL, CKMB, CKMBINDEX, TROPONINI in the last 168 hours. BNP (last 3 results) No results for input(s): PROBNP in the last 8760 hours. HbA1C: No results for input(s): HGBA1C in the last 72 hours. CBG: No results for input(s): GLUCAP in the last 168 hours. Lipid Profile: Recent Labs    10/05/19 1220  TRIG 77   Thyroid Function Tests: No results for input(s): TSH, T4TOTAL, FREET4, T3FREE, THYROIDAB in the last 72 hours. Anemia Panel: Recent Labs    10/06/19 0620 10/07/19 0656  FERRITIN 1,187* 1,210*   Urine analysis: No results found for: COLORURINE, APPEARANCEUR, LABSPEC, PHURINE, GLUCOSEU, HGBUR, BILIRUBINUR, KETONESUR, PROTEINUR, UROBILINOGEN, NITRITE, LEUKOCYTESUR   Jaedon Siler M.D. [facility] 10/07/2019, 12:58 PM   Call night coverage person covering after  7pm

## 2019-10-07 NOTE — Progress Notes (Signed)
Writer informed  That pt will need CPAP at night. Questioned pt and he has refused CPAP while in the hospital

## 2019-10-08 LAB — COMPREHENSIVE METABOLIC PANEL
ALT: 56 U/L — ABNORMAL HIGH (ref 0–44)
AST: 61 U/L — ABNORMAL HIGH (ref 15–41)
Albumin: 3.1 g/dL — ABNORMAL LOW (ref 3.5–5.0)
Alkaline Phosphatase: 62 U/L (ref 38–126)
Anion gap: 11 (ref 5–15)
BUN: 29 mg/dL — ABNORMAL HIGH (ref 6–20)
CO2: 22 mmol/L (ref 22–32)
Calcium: 8.4 mg/dL — ABNORMAL LOW (ref 8.9–10.3)
Chloride: 101 mmol/L (ref 98–111)
Creatinine, Ser: 1 mg/dL (ref 0.61–1.24)
GFR calc Af Amer: >60 mL/min (ref >60)
GFR calc non Af Amer: >60 mL/min (ref >60)
Glucose, Bld: 157 mg/dL — ABNORMAL HIGH (ref 70–99)
Potassium: 4.4 mmol/L (ref 3.5–5.1)
Sodium: 134 mmol/L — ABNORMAL LOW (ref 135–145)
Total Bilirubin: 0.8 mg/dL (ref 0.3–1.2)
Total Protein: 6.9 g/dL (ref 6.5–8.1)

## 2019-10-08 LAB — CBC WITH DIFFERENTIAL/PLATELET
Abs Immature Granulocytes: 0.06 10*3/uL (ref 0.00–0.07)
Basophils Absolute: 0 10*3/uL (ref 0.0–0.1)
Basophils Relative: 0 %
Eosinophils Absolute: 0 10*3/uL (ref 0.0–0.5)
Eosinophils Relative: 0 %
HCT: 48.1 % (ref 39.0–52.0)
Hemoglobin: 15.7 g/dL (ref 13.0–17.0)
Immature Granulocytes: 0 %
Lymphocytes Relative: 10 %
Lymphs Abs: 1.3 10*3/uL (ref 0.7–4.0)
MCH: 28.8 pg (ref 26.0–34.0)
MCHC: 32.6 g/dL (ref 30.0–36.0)
MCV: 88.3 fL (ref 80.0–100.0)
Monocytes Absolute: 0.6 10*3/uL (ref 0.1–1.0)
Monocytes Relative: 5 %
Neutro Abs: 11.5 10*3/uL — ABNORMAL HIGH (ref 1.7–7.7)
Neutrophils Relative %: 85 %
Platelets: 238 10*3/uL (ref 150–400)
RBC: 5.45 MIL/uL (ref 4.22–5.81)
RDW: 14.6 % (ref 11.5–15.5)
WBC: 13.5 10*3/uL — ABNORMAL HIGH (ref 4.0–10.5)
nRBC: 0 % (ref 0.0–0.2)

## 2019-10-08 LAB — D-DIMER, QUANTITATIVE: D-Dimer, Quant: 0.37 ug/mL-FEU (ref 0.00–0.50)

## 2019-10-08 LAB — C-REACTIVE PROTEIN: CRP: 1.9 mg/dL — ABNORMAL HIGH (ref <1.0)

## 2019-10-08 LAB — FERRITIN: Ferritin: 1021 ng/mL — ABNORMAL HIGH (ref 24–336)

## 2019-10-08 MED ORDER — PREDNISONE 10 MG PO TABS: ORAL_TABLET | ORAL | 0 refills | Status: DC

## 2019-10-08 MED ORDER — PREDNISONE 10 MG PO TABS: ORAL_TABLET | ORAL | 0 refills | Status: AC

## 2019-10-08 MED FILL — predniSONE 10 MG TABS: 10 | 12 days supply | Qty: 30 | Fill #0

## 2019-10-08 NOTE — Progress Notes (Signed)
SATURATION QUALIFICATIONS: (This note is used to comply with regulatory documentation for home oxygen)  Patient Saturations on Room Air at Rest = 96%  Patient Saturations on Room Air while Ambulating = 88%  Patient Saturations on 2 Liters of oxygen while Ambulating = 98%  Please briefly explain why patient needs home oxygen: Needs 2 Liters of oxygen for discharge due to oxygen levels on room air while ambulating drop below 90%.

## 2019-10-08 NOTE — TOC Initial Note (Signed)
Transition of Care [facility]) - Initial/Assessment Note    Patient Details  Name: Jay Gonzalez MRN: [id number] Date of Birth: 1965/03/30  Transition of Care [facility]) CM/SW Contact:    Bartholome Bill, RN Phone Number: 10/08/2019, 2:09 PM  Clinical Narrative:                 TOC alerted of need for home 02. [employer] liaison contacted for home 02 referral. Pt is arranging to have someone pick up his house key from the hospital to get into his home for delivery of 02 concentrator and portable tank.   This CM contacted [facility] outpatient pharmacy to alert of need for dc script to be delivered to pt on the unit.  Expected Discharge Plan: Home/Self Care   Expected Discharge Plan and Services Expected Discharge Plan: Home/Self Care         Expected Discharge Date: 10/08/19               DME Arranged: Oxygen DME Agency: [facility] Date DME Agency Contacted: 10/08/19 Time DME Agency Contacted: [phone] Representative spoke with at DME Agency: Fayrene Fearing           Activities of Daily Living Home Assistive Devices/Equipment: Eyeglasses ADL Screening (condition at time of admission) Patient's cognitive ability adequate to safely complete daily activities?: Yes Is the patient deaf or have difficulty hearing?: No Does the patient have difficulty seeing, even when wearing glasses/contacts?: No Does the patient have difficulty concentrating, remembering, or making decisions?: No Patient able to express need for assistance with ADLs?: Yes Does the patient have difficulty dressing or bathing?: No Independently performs ADLs?: Yes (appropriate for developmental age) Does the patient have difficulty walking or climbing stairs?: Yes Weakness of Legs: Both Weakness of Arms/Hands: Both  Admission diagnosis:  Acute hypoxemic respiratory failure (HCC) [J96.01] Pneumonia due to COVID-19 virus [U07.1, J12.82] COVID-19 [U07.1] Patient Active Problem List   Diagnosis Date Noted  . Pneumonia due to COVID-19  virus 10/05/2019  . Essential hypertension 04/24/2015  . H/O laparoscopic adjustable gastric banding 04/24/2015  . Sleep apnea with use of continuous positive airway pressure (CPAP) 04/24/2015  . DYSLIPIDEMIA 05/12/2007  . OBESITY 05/12/2007  . FOOT PAIN, CHRONIC 05/12/2007   PCP:  Blair Heys, MD Pharmacy:   [facility] [id number] - [place], [place] - [address] [place] [address] Phone: [phone] Fax: [phone]  [facility] - [place], [place] - [address] [place] [address] Phone: [phone] Fax: [phone]     Social Determinants of Health (SDOH) Interventions    Readmission Risk Interventions No flowsheet data found.

## 2019-10-08 NOTE — Discharge Instructions (Signed)
Patient scheduled for outpatient Remdesivir infusions at 11am at [facility]. Please inform the patient to park at [address] [place], [place], as staff will be escorting the patient through the east entrance of the hospital.    There is a wave flag banner located near the entrance on [address]. Turn into this entrance and immediately turn left and park in 1 of the 5 designated Covid Infusion Parking spots. There is a phone number on the sign, please call and let the staff know what spot you are in and we will come out and get you. For questions call [phone].  Thanks.

## 2019-10-08 NOTE — Progress Notes (Signed)
Patient scheduled for outpatient Remdesivir infusions at 11am at [facility]. Please inform the patient to park at [address], [place], as staff will be escorting the patient through the east entrance of the hospital.  °  °There is a wave flag banner located near the entrance on [address] Turn into this entrance and immediately turn left and park in 1 of the 5 designated Covid Infusion Parking spots. There is a phone number on the sign, please call and let the staff know what spot you are in and we will come out and get you. For questions call [phone].  Thanks. ° °

## 2019-10-08 NOTE — Progress Notes (Signed)
   10/06/19 1532  Assess: MEWS Score  Temp (!) 100.5 F (38.1 C)  BP 138/85  Pulse Rate 91  Resp (!) 25  Level of Consciousness Alert  SpO2 92 %  O2 Device Nasal Cannula  O2 Flow Rate (L/min) 2 L/min  Assess: MEWS Score  MEWS Temp 1  MEWS Systolic 0  MEWS Pulse 0  MEWS RR 1  MEWS LOC 0  MEWS Score 2  MEWS Score Color Yellow  Assess: if the MEWS score is Yellow or Red  Were vital signs taken at a resting state? Yes  Focused Assessment No change from prior assessment  Early Detection of Sepsis Score *See Row Information* Low  MEWS guidelines implemented *See Row Information* Yes  Treat  MEWS Interventions Escalated (See documentation below);Administered prn meds/treatments (will medicate for fever)  Pain Scale 0-10  Pain Score 0  Take Vital Signs  Increase Vital Sign Frequency  Yellow: Q 2hr X 2 then Q 4hr X 2, if remains yellow, continue Q 4hrs  Escalate  MEWS: Escalate Yellow: discuss with charge nurse/RN and consider discussing with provider and RRT  Notify: Charge Nurse/RN  Name of Charge Nurse/RN Notified Toniann Fail RN  Date Charge Nurse/RN Notified 10/06/19  Time Charge Nurse/RN Notified 1535  Notify: Provider  Provider Name/Title wendy rn (charge rn)  Date Provider Notified 10/06/19  Time Provider Notified 1535  Notification Type Face-to-face  Notification Reason Other (Comment) (yellow mews on arrival to floor)  Response Other (Comment) (will monitor pt)  Date of Provider Response 10/06/19  Time of Provider Response 1536  Document  Patient Outcome Not stable and remains on department

## 2019-10-09 ENCOUNTER — Ambulatory Visit (HOSPITAL_COMMUNITY)
Admit: 2019-10-09 | Discharge: 2019-10-09 | Disposition: A | Discharge status: Home / Self Care | Payer: 59 | Attending: Pulmonary Disease | Admitting: Pulmonary Disease

## 2019-10-09 DIAGNOSIS — J1282 Pneumonia due to coronavirus disease 2019: Secondary | ICD-10-CM | POA: Diagnosis not present

## 2019-10-09 DIAGNOSIS — U071 COVID-19: Secondary | ICD-10-CM | POA: Diagnosis present

## 2019-10-09 MED ORDER — SODIUM CHLORIDE 0.9 % IV SOLN: INTRAVENOUS | Status: DC | PRN

## 2019-10-09 MED ORDER — ALBUTEROL SULFATE HFA 108 (90 BASE) MCG/ACT IN AERS: 2.0000 | INHALATION_SPRAY | Freq: Once | RESPIRATORY_TRACT | Status: DC | PRN

## 2019-10-09 MED ORDER — METHYLPREDNISOLONE SODIUM SUCC 125 MG IJ SOLR: 125.0000 mg | Freq: Once | INTRAMUSCULAR | Status: DC | PRN

## 2019-10-09 MED ORDER — FAMOTIDINE IN NACL 20-0.9 MG/50ML-% IV SOLN: 20.0000 mg | Freq: Once | INTRAVENOUS | Status: DC | PRN

## 2019-10-09 MED ORDER — EPINEPHRINE 0.3 MG/0.3ML IJ SOAJ: 0.3000 mg | Freq: Once | INTRAMUSCULAR | Status: DC | PRN

## 2019-10-09 MED ORDER — DIPHENHYDRAMINE HCL 50 MG/ML IJ SOLN: 50.0000 mg | Freq: Once | INTRAMUSCULAR | Status: DC | PRN

## 2019-10-09 MED ORDER — SODIUM CHLORIDE 0.9 % IV SOLN
100.0000 mg | Freq: Once | INTRAVENOUS | Status: AC
  Administered 2019-10-09: 100 mg via INTRAVENOUS

## 2019-10-09 NOTE — Progress Notes (Signed)
  Diagnosis: COVID-19  Physician: Dr Shan Levans  Procedure: Covid Infusion Clinic Med: remdesivir infusion - Provided patient with remdesivir fact sheet for patients, parents and caregivers prior to infusion.  Complications: No immediate complications noted.  Discharge: Discharged home   William Dalton 10/09/2019

## 2019-10-09 NOTE — Discharge Summary (Signed)
Physician Discharge Summary  Jay FabianRodney E Gonzalez ZOX:[id number] DOB: July 24, 1965 DOA: 10/05/2019  PCP: Blair HeysEhinger, Robert, MD  Admit date: 10/05/2019 Discharge date: 10/09/2019  Admitted From: Home Disposition: Home  Recommendations for Outpatient Follow-up:  1. Follow up with PCP in 1-2 weeks 2. Please obtain BMP/CBC in one week  Home Health: None Equipment/Devices: Oxygen  Discharge Condition: Stable CODE STATUS: Full Diet recommendation: Low-salt, low-fat diet as tolerated  Brief/Interim Summary: Patient is a 54 year old male with history of hypertension, obesity, OSA on CPAP presented to ED with increased shortness of breath for the past week.  Patient ported subjective fevers and chills, myalgias, diarrhea with cough.  No nausea or vomiting or chest pain. Of note, patient is not vaccinated against COVID-19 In ED, patient was found to have COVID-19 positive, chest x-ray findings with bilateral pneumonia, inflammatory markers elevated. Patient was given 1 dose of IV Decadron and started on remdesivir, placed on 2 L.  Patient admitted as above with acute hypoxic respiratory failure in setting of COVID-19 pneumonia.  Despite being unvaccinated patient resolved quite quickly with IV steroids, oxygen support and Remdesivir.  He had completed all but one of his Remdesivir infusions and as such was set up to have final infusion on 10/09/2019 in the outpatient setting.  He was discharged with remainder of steroid taper and was noted to have mild hypoxia with exertion requiring 2 L nasal cannula with exertion only.  At this time given patient's rapid recovery and improving clinical status we discussed possible discharge home with close follow-up outpatient and patient was agreeable.  As above he will complete his last course of Remdesivir on 10/09/2019, continue prednisone taper in the outpatient setting, as well as were oxygen in the form of 2 L via nasal cannula with any exertion.  We discussed need for close  follow-up with PCP in the next 3 to 5 days, he should report back to the ED if he has any worsening symptoms including but not limited to shortness of breath, chest pain, dizziness, headache, fevers, chills, profound diarrhea nausea or vomiting with inability to maintain adequate p.o. intake.   Discharge Diagnoses:  Principal Problem:   Pneumonia due to COVID-19 virus Active Problems:   OBESITY   Essential hypertension   Sleep apnea with use of continuous positive airway pressure (CPAP)    Discharge Instructions  Discharge Instructions    Call MD for:  difficulty breathing, headache or visual disturbances   Complete by: As directed    Call MD for:  persistant dizziness or light-headedness   Complete by: As directed    Diet - low sodium heart healthy   Complete by: As directed    Increase activity slowly   Complete by: As directed      Allergies as of 10/08/2019   No Known Allergies     Medication List    STOP taking these medications   diclofenac 75 MG EC tablet Commonly known as: VOLTAREN     TAKE these medications   BC HEADACHE POWDER PO Take 1-2 packets by mouth daily as needed (back pain).   calcium carbonate 500 MG chewable tablet Commonly known as: TUMS - dosed in mg elemental calcium Chew 3 tablets by mouth daily as needed for indigestion or heartburn.   lisinopril-hydrochlorothiazide 20-25 MG tablet Commonly known as: ZESTORETIC Take 1 tablet by mouth daily.   polyethylene glycol powder 17 GM/SCOOP powder Commonly known as: GlycoLax Take 17 g by mouth daily. Repeat every 6 to 8 hours as needed  until loose stools What changed:   when to take this  reasons to take this  additional instructions   predniSONE 10 MG tablet Commonly known as: DELTASONE Take 4 tablets (40 mg total) by mouth daily for 3 days, THEN 3 tablets (30 mg total) daily for 3 days, THEN 2 tablets (20 mg total) daily for 3 days, THEN 1 tablet (10 mg total) daily for 3 days. Start  taking on: October 08, 2019       No Known Allergies  Consultations:  None   Procedures/Studies: DG Chest 2 View  Result Date: 10/05/2019 CLINICAL DATA:  Productive cough, fever, shortness of breath EXAM: CHEST - 2 VIEW COMPARISON:  10/04/2019 FINDINGS: Normal heart size, mediastinal contours, and pulmonary vascularity. Hazy infiltrates consistent with pneumonia unchanged. No pleural effusion or pneumothorax. IMPRESSION: Persistent hazy infiltrates bilaterally consistent with pneumonia. Electronically Signed   By: Ulyses Southward M.D.   On: 10/05/2019 11:24   DG Chest 2 View  Result Date: 10/04/2019 CLINICAL DATA:  Shortness of breath, cough, fever EXAM: CHEST - 2 VIEW COMPARISON:  05/27/2019 FINDINGS: Heart is normal size. Patchy bilateral airspace opacities. No effusions. No acute bony abnormality. IMPRESSION: Patchy bilateral airspace disease concerning for pneumonia. Electronically Signed   By: Charlett Nose M.D.   On: 10/04/2019 22:12      Subjective: No acute issues or events overnight, patient ambulating around the room this morning without overt dyspnea, mild hypoxia on room air but otherwise denies nausea, vomiting, diarrhea, constipation, headache, fever, chills   Discharge Exam: Vitals:   10/08/19 0540 10/08/19 0900  BP: (!) 132/95 (!) 136/92  Pulse: 76 81  Resp: 20 18  Temp: 97.8 F (36.6 C) 98.2 F (36.8 C)  SpO2: (!) 85% 96%   Vitals:   10/07/19 1349 10/07/19 2018 10/08/19 0540 10/08/19 0900  BP: (!) 144/96 131/82 (!) 132/95 (!) 136/92  Pulse: 80 87 76 81  Resp:  (!) 21 20 18   Temp: 98.2 F (36.8 C) 98.7 F (37.1 C) 97.8 F (36.6 C) 98.2 F (36.8 C)  TempSrc:  Oral Oral Oral  SpO2: (!) 88% (!) 89% (!) 85% 96%  Weight:      Height:        General: Pt is alert, awake, not in acute distress Cardiovascular: RRR, S1/S2 +, no rubs, no gallops Respiratory: CTA bilaterally, no wheezing, no rhonchi Abdominal: Soft, NT, ND, bowel sounds + Extremities: no edema,  no cyanosis    The results of significant diagnostics from this hospitalization (including imaging, microbiology, ancillary and laboratory) are listed below for reference.     Microbiology: Recent Results (from the past 240 hour(s))  Blood Culture (routine x 2)     Status: None (Preliminary result)   Collection Time: 10/05/19 11:38 AM   Specimen: BLOOD  Result Value Ref Range Status   Specimen Description   Final    BLOOD RIGHT ANTECUBITAL Performed at [facility], [address] [place], [place]    Special Requests   Final    Blood Culture results may not be optimal due to an inadequate volume of blood received in culture bottles Performed at [facility], [address] [place], [place]    Culture   Final    NO GROWTH 4 DAYS Performed at [facility], [address], [place], [place]    Report Status PENDING  Incomplete  SARS Coronavirus 2 by RT PCR (hospital order, performed in  [facility] hospital lab) Nasopharyngeal Nasopharyngeal Swab     Status: Abnormal   Collection Time: 10/05/19 12:01 PM   Specimen: Nasopharyngeal Swab  Result Value Ref Range Status   SARS Coronavirus 2 POSITIVE (A) NEGATIVE Final    Comment: CRITICAL RESULT CALLED TO, READ BACK BY AND VERIFIED WITH: BALDWIN,L. RN @1343  10/05/19 BILLINGSLEY,L (NOTE) SARS-CoV-2 target nucleic acids are DETECTED  SARS-CoV-2 RNA is generally detectable in upper respiratory specimens  during the acute phase of infection.  Positive results are indicative  of the presence of the identified virus, but do not rule out bacterial infection or co-infection with other pathogens not detected by the test.  Clinical correlation with patient history and  other diagnostic information is necessary to determine patient infection status.  The expected result is negative.  Fact Sheet for Patients:   10/07/19   Fact Sheet  for Healthcare Providers:   BoilerBrush.com.cy    This test is not yet approved or cleared by the https://pope.com/ FDA and  has been authorized for detection and/or diagnosis of SARS-CoV-2 by FDA under an Emergency Use Authorization (EUA).  This EUA will remain in effect  (meaning this test can be used) for the duration of  the COVID-19 declaration under Section 564(b)(1) of the Act, 21 U.S.C. section 360-bbb-3(b)(1), unless the authorization is terminated or revoked sooner.  Performed at [facility], [address] [place], [place]   Blood Culture (routine x 2)     Status: None (Preliminary result)   Collection Time: 10/05/19 12:20 PM   Specimen: BLOOD  Result Value Ref Range Status   Specimen Description   Final    BLOOD LEFT ANTECUBITAL Performed at [facility], [address], [place], [place]    Special Requests   Final    BOTTLES DRAWN AEROBIC AND ANAEROBIC Blood Culture adequate volume Performed at [facility], [address] [place], [place]    Culture   Final    NO GROWTH 4 DAYS Performed at [facility], [address], [place], [place]    Report Status PENDING  Incomplete     Labs: BNP (last 3 results) Recent Labs    10/06/19 1234  BNP 38.6   Basic Metabolic Panel: Recent Labs  Lab 10/05/19 1220 10/05/19 1858 10/06/19 0620 10/07/19 0656 10/08/19 0501  NA 132*  --  135 135 134*  K 4.2  --  4.4 4.3 4.4  CL 97*  --  99 101 101  CO2 23  --  27 25 22   GLUCOSE 114*  --  106* 157* 157*  BUN 24*  --  20 25* 29*  CREATININE 1.48* 1.48* 1.04 1.02 1.00  CALCIUM 8.2*  --  8.5* 8.5* 8.4*   Liver Function Tests: Recent Labs  Lab 10/05/19 1220 10/06/19 0620 10/07/19 0656 10/08/19 0501  AST 52* 60* 54* 61*  ALT 39 39 40 56*  ALKPHOS 76 69 68 62  BILITOT 0.6 0.8 0.7 0.8  PROT 8.1 7.3 7.5 6.9  ALBUMIN 3.9 3.4* 3.4* 3.1*   No  results for input(s): LIPASE, AMYLASE in the last 168 hours. No results for input(s): AMMONIA in the last 168 hours. CBC: Recent Labs  Lab 10/05/19 1220 10/05/19 1858 10/06/19 0620 10/07/19 0656 10/08/19 0501  WBC 7.8 6.6 7.2 6.9 13.5*  NEUTROABS 6.6  --  5.6 5.6 11.5*  HGB 16.3 15.3 15.8 15.8 15.7  HCT 48.2 44.6 47.3 47.2  48.1  MCV 85.5 85.1 86.0 86.0 88.3  PLT 162 159 172 213 238   Cardiac Enzymes: No results for input(s): CKTOTAL, CKMB, CKMBINDEX, TROPONINI in the last 168 hours. BNP: Invalid input(s): POCBNP CBG: No results for input(s): GLUCAP in the last 168 hours. D-Dimer Recent Labs    10/07/19 0656 10/08/19 0501  DDIMER 0.51* 0.37   Hgb A1c No results for input(s): HGBA1C in the last 72 hours. Lipid Profile No results for input(s): CHOL, HDL, LDLCALC, TRIG, CHOLHDL, LDLDIRECT in the last 72 hours. Thyroid function studies No results for input(s): TSH, T4TOTAL, T3FREE, THYROIDAB in the last 72 hours.  Invalid input(s): FREET3 Anemia work up [employer]    10/07/19 0656 10/08/19 0501  FERRITIN 1,210* 1,021*   Urinalysis No results found for: COLORURINE, APPEARANCEUR, LABSPEC, PHURINE, GLUCOSEU, HGBUR, BILIRUBINUR, KETONESUR, PROTEINUR, UROBILINOGEN, NITRITE, LEUKOCYTESUR Sepsis Labs Invalid input(s): PROCALCITONIN,  WBC,  LACTICIDVEN Microbiology Recent Results (from the past 240 hour(s))  Blood Culture (routine x 2)     Status: None (Preliminary result)   Collection Time: 10/05/19 11:38 AM   Specimen: BLOOD  Result Value Ref Range Status   Specimen Description   Final    BLOOD RIGHT ANTECUBITAL Performed at [facility], [address] [place], [place] [address]    Special Requests   Final    Blood Culture results may not be optimal due to an inadequate volume of blood received in culture bottles Performed at [facility], [address], [place], [place] [address]    Culture   Final    NO GROWTH 4  DAYS Performed at [facility], [address], [place], [place] [address]    Report Status PENDING  Incomplete  SARS Coronavirus 2 by RT PCR (hospital order, performed in [facility] hospital lab) Nasopharyngeal Nasopharyngeal Swab     Status: Abnormal   Collection Time: 10/05/19 12:01 PM   Specimen: Nasopharyngeal Swab  Result Value Ref Range Status   SARS Coronavirus 2 POSITIVE (A) NEGATIVE Final    Comment: CRITICAL RESULT CALLED TO, READ BACK BY AND VERIFIED WITH: BALDWIN,L. RN  10/05/19 BILLINGSLEY,L (NOTE) SARS-CoV-2 target nucleic acids are DETECTED  SARS-CoV-2 RNA is generally detectable in upper respiratory specimens  during the acute phase of infection.  Positive results are indicative  of the presence of the identified virus, but do not rule out bacterial infection or co-infection with other pathogens not detected by the test.  Clinical correlation with patient history and  other diagnostic information is necessary to determine patient infection status.  The expected result is negative.  Fact Sheet for Patients:   BoilerBrush.com.cy   Fact Sheet for Healthcare Providers:   https://pope.com/    This test is not yet approved or cleared by the [place] FDA and  has been authorized for detection and/or diagnosis of SARS-CoV-2 by FDA under an Emergency Use Authorization (EUA).  This EUA will remain in effect  (meaning this test can be used) for the duration of  the COVID-19 declaration under Section 564(b)(1) of the Act, 21 U.S.C. section 360-bbb-3(b)(1), unless the authorization is terminated or revoked sooner.  Performed at [facility], [address], [place], [place] [address]   Blood Culture (routine x 2)     Status: None (Preliminary result)   Collection Time: 10/05/19 12:20 PM   Specimen: BLOOD  Result Value Ref Range Status   Specimen Description   Final    BLOOD LEFT  ANTECUBITAL  Performed at [facility], [address] [place], [place] [address]    Special Requests   Final    BOTTLES DRAWN AEROBIC AND ANAEROBIC Blood Culture adequate volume Performed at [facility], [address] [place], [place] [address]    Culture   Final    NO GROWTH 4 DAYS Performed at [facility], [address] [place], [place] [address]    Report Status PENDING  Incomplete     Time coordinating discharge: Over 30 minutes  SIGNED:   Azucena Fallen, DO [facility] 10/09/2019, 8:57 AM Pager   If 7PM-7AM, please contact night-coverage [email or web address]

## 2019-10-09 NOTE — Progress Notes (Signed)
Patient arrived to infusion clinic for remdesivir, pt with sat in the mid 80 on 2L o2. Pt voices he has been recently d/c'ed from hospital and was plaed on 2L due to illness, he also has been walking in on [facility] for 30 mmin trying to find clinic.   POC: pt resting in chair, O2 now at sating 91% 2L. Pt also voices he feel much better.

## 2019-10-09 NOTE — Discharge Instructions (Signed)

## 2019-10-10 LAB — CULTURE, BLOOD (ROUTINE X 2)
Culture: NO GROWTH
Culture: NO GROWTH
Special Requests: ADEQUATE

## 2019-10-25 ENCOUNTER — Other Ambulatory Visit: Payer: Self-pay | Admitting: Family Medicine

## 2019-10-25 ENCOUNTER — Ambulatory Visit
Admission: RE | Admit: 2019-10-25 | Discharge: 2019-10-25 | Disposition: A | Discharge status: Home / Self Care | Payer: 59 | Source: Ambulatory Visit | Attending: Family Medicine | Admitting: Family Medicine

## 2019-10-25 DIAGNOSIS — R0600 Dyspnea, unspecified: Secondary | ICD-10-CM

## 2019-11-12 ENCOUNTER — Encounter: Payer: Self-pay | Admitting: Internal Medicine

## 2019-11-12 ENCOUNTER — Other Ambulatory Visit: Payer: Self-pay

## 2019-11-12 ENCOUNTER — Ambulatory Visit (INDEPENDENT_AMBULATORY_CARE_PROVIDER_SITE_OTHER): Payer: 59 | Admitting: Internal Medicine

## 2019-11-12 VITALS — BP 126/86 | HR 94 | Temp 98.0°F | Ht 70.0 in | Wt 331.4 lb

## 2019-11-12 DIAGNOSIS — U071 COVID-19: Secondary | ICD-10-CM | POA: Diagnosis not present

## 2019-11-12 DIAGNOSIS — R0609 Other forms of dyspnea: Secondary | ICD-10-CM

## 2019-11-12 DIAGNOSIS — R0600 Dyspnea, unspecified: Secondary | ICD-10-CM | POA: Diagnosis not present

## 2019-11-12 DIAGNOSIS — J1282 Pneumonia due to coronavirus disease 2019: Secondary | ICD-10-CM | POA: Diagnosis not present

## 2019-11-12 NOTE — Progress Notes (Signed)
OV 11/12/2019   Subjective:  Patient ID: Jay Gonzalez, male , DOB: Jun 05, 1965 , age 54 y.o. , MRN: [id number] , ADDRESS: [address] [place] [address] PCP Blair Heys, MD  11/12/2019 -   Chief Complaint  Patient presents with  . Consult    Covid in August, shortness of breath with exertion, coughs when in the shower.    History is obtained from talking to the patient and review of the medical records.  [name] 54 y.o. -morbidly obese male with sleep apnea using CPAP at home and hypertension.  Admitted October 05, 2019 with COVID-19.  He was previously not vaccinated [at this point in time is willing to have the vaccine].  He was discharged on oxygen 2 L on October 09, 2019.  He finished outpatient steroids and outpatient remdesivir for this.  He says after that he is followed up with his primary care physician.  It appears this follow-up-on October 25, 2019.  The chest x-ray at that time showed persistent infiltrates.  I personally visualized this chest x-ray.  He says after this he got a 9-day prednisone taper that he thinks he ended 5 or 6 days ago.  He says that overall he is 90% improved.  He has some mild residual shortness of breath when he overexerts himself.  He continues his CPAP.  He is not using his oxygen 2 L as prescribed.  He feels he does not need it.  Review of the labs indicate his D-dimer has normalized.  He is here with pulmonary just to make sure everything is okay.  His current walking desaturation test is listed below.   Simple office walk 185 feet x  3 laps goal with forehead probe 11/12/2019   O2 used ra  Number laps completed 3  Comments about pace nl  Resting Pulse Ox/HR 96% and 86/min  Final Pulse Ox/HR 94% and 117/min  Desaturated </= 88% no  Desaturated <= 3% points no  Got Tachycardic >/= 90/min yes  Symptoms at end of test dysppnea  Miscellaneous comments x        Results for ESGAR, Jay Gonzalez (MRN [id number]) as of  11/12/2019 10:15  Ref. Range 10/05/2019 12:20 10/06/2019 06:20 10/07/2019 06:56 10/08/2019 05:01  D-Dimer, Quant Latest Ref Range: 0.00 - 0.50 ug/mL-FEU 0.76 (H) 0.78 (H) 0.51 (H) 0.37   ROS - per HPI  Results for Jay, Gonzalez (MRN [id number]) as of 11/12/2019 10:15  Ref. Range 10/05/2019 12:01 10/05/2019 18:58  SARS CORONAVIRUS 2 (HOSPITAL ORDER, PERFORMED IN [facility]) Unknown Rpt (A)   HIV Screen 4th Generation wRfx Latest Ref Range: Non Reactive   Non Reactive     has a past medical history of COVID-19 (10/06/2019), Hypertension, Morbid obesity (HCC), and Sleep apnea.   reports that he has never smoked. He has never used smokeless tobacco.  Past Surgical History:  Procedure Laterality Date  . ADENOIDECTOMY    . COLONOSCOPY WITH PROPOFOL N/A 03/17/2016   Procedure: COLONOSCOPY WITH PROPOFOL;  Surgeon: Willis Modena, MD;  Location: [facility];  Service: Endoscopy;  Laterality: N/A;  . EYE SURGERY  1971, 1981  . EYE SURGERY    . LAPAROSCOPIC GASTRIC BANDING  10/19/2011   Procedure: LAPAROSCOPIC GASTRIC BANDING;  Surgeon: Lodema Pilot, DO;  Location: [facility];  Service: General;  Laterality: N/A;    No Known Allergies  Immunization History  Administered Date(s) Administered  . Td 05/05/2007    Family  History  Problem Relation Age of Onset  . Cancer Paternal Aunt   . Cancer Paternal Aunt   . Cancer Paternal Aunt      Current Outpatient Medications:  .  Aspirin-Salicylamide-Caffeine (BC HEADACHE POWDER PO), Take 1-2 packets by mouth daily as needed (back pain). , Disp: , Rfl:  .  calcium carbonate (TUMS - DOSED IN MG ELEMENTAL CALCIUM) 500 MG chewable tablet, Chew 3 tablets by mouth daily as needed for indigestion or heartburn., Disp: , Rfl:  .  lisinopril-hydrochlorothiazide (PRINZIDE,ZESTORETIC) 20-25 MG tablet, Take 1 tablet by mouth daily. , Disp: , Rfl: 3 .  polyethylene glycol powder (GLYCOLAX) powder, Take 17 g by mouth daily. Repeat every 6 to 8 hours as  needed until loose stools (Patient taking differently: Take 17 g by mouth daily as needed for mild constipation. ), Disp: 255 g, Rfl: 0      Objective:   Vitals:   11/12/19 1000  BP: 126/86  Pulse: 94  Temp: 98 F (36.7 C)  TempSrc: Temporal  SpO2: 94%  Weight: (!) 331 lb 6.4 oz (150.3 kg)  Height: 5\' 10"  (1.778 m)    Estimated body mass index is 47.55 kg/m as calculated from the following:   Height as of this encounter: 5\' 10"  (1.778 m).   Weight as of this encounter: 331 lb 6.4 oz (150.3 kg).  @WEIGHTCHANGE @    11/12/19 1000  Weight: (!) 331 lb 6.4 oz (150.3 kg)     Physical Exam  General Appearance:    Alert, cooperative, no distress, appears stated age - yes , Deconditioned looking - no , OBESE  - yes, Sitting on Wheelchair -  no  Head:    Normocephalic, without obvious abnormality, atraumatic  Eyes:    PERRL, conjunctiva/corneas clear,  Ears:    Normal TM's and external ear canals, both ears  Nose:   Nares normal, septum midline, mucosa normal, no drainage    or sinus tenderness. OXYGEN ON  - no . Patient is @ ra   Throat:   Lips, mucosa, and tongue normal; teeth and gums normal. Cyanosis on lips - no  Neck:   Supple, symmetrical, trachea midline, no adenopathy;    thyroid:  no enlargement/tenderness/nodules; no carotid   bruit or JVD  Back:     Symmetric, no curvature, ROM normal, no CVA tenderness  Lungs:     Distress - no , Wheeze no, Barrell Chest - no, Purse lip breathing - no, Crackles - no   Chest Wall:    No tenderness or deformity.    Heart:    Regular rate and rhythm, S1 and S2 normal, no rub   or gallop, Murmur - no  Breast Exam:    NOT DONE  Abdomen:     Soft, non-tender, bowel sounds active all four quadrants,    no masses, no organomegaly. Visceral obesity - yes  Genitalia:   NOT DONE  Rectal:   NOT DONE  Extremities:   Extremities - normal, Has Cane - no, Clubbing - no, Edema - no  Pulses:   2+ and symmetric all extremities    Skin:   Stigmata of Connective Tissue Disease - no  Lymph nodes:   Cervical, supraclavicular, and axillary nodes normal  Psychiatric:  Neurologic:   Pleasant - yes, Anxious - no, Flat affect - no  CAm-ICU - neg, Alert and Oriented x 3 - yes, Moves all 4s - yes, Speech - normal, Cognition - intact  Assessment:       ICD-10-CM   1. Pneumonia due to COVID-19 virus  U07.1    J12.82   2. Dyspnea on exertion  R06.00        Plan:     Patient Instructions     ICD-10-CM   1. Pneumonia due to COVID-19 virus  U07.1    J12.82   2. Dyspnea on exertion  R06.00    Oxygen levels holding on walking desaturation test today in our office This is approximately 5 weeks post COVID-19 Based on his symptoms and walking desaturation test I think he of course is 1 of improvement He already had 2 courses of prednisone   Plan -At this point in time recommend watchful waiting -Do chest x-ray two-view in 4 weeks -Continue your CPAP for sleep apnea -Keep yourself active -Monitor your legs for swelling and any changes in shortness of breath or symptoms  Follow-up -Nurse practitioner visit in 4 weeks to ensure clearance of chest x-ray infiltrates - -Return sooner if needed     SIGNATURE    Dr. Kalman Shan, M.D., F.C.C.P,  Pulmonary and Critical Care Medicine Staff Physician, [facility] Director - Interstitial Lung Disease  Program  Pulmonary Fibrosis [facility] at [facility] [place], [place], [address]  Pager: [phone], If no answer or between  15:00h - 7:00h: call [phone] Telephone: [phone]  10:34 AM 11/12/2019

## 2019-11-12 NOTE — Patient Instructions (Addendum)
ICD-10-CM   1. Pneumonia due to COVID-19 virus  U07.1    J12.82   2. Dyspnea on exertion  R06.00    Oxygen levels holding on walking desaturation test today in our office This is approximately 5 weeks post COVID-19 Based on his symptoms and walking desaturation test I think he of course is 1 of improvement He already had 2 courses of prednisone   Plan -At this point in time recommend watchful waiting -Do chest x-ray two-view in 4 weeks -Continue your CPAP for sleep apnea -Keep yourself active -Monitor your legs for swelling and any changes in shortness of breath or symptoms  Follow-up -Nurse practitioner visit in 4 weeks to ensure clearance of chest x-ray infiltrates - -Return sooner if needed

## 2019-11-12 NOTE — Addendum Note (Signed)
Addended by: Benjie Karvonen R on: 11/12/2019 10:39 AM   Modules accepted: Orders

## 2019-12-10 ENCOUNTER — Ambulatory Visit (INDEPENDENT_AMBULATORY_CARE_PROVIDER_SITE_OTHER): Payer: 59 | Admitting: Pulmonary Disease

## 2019-12-10 ENCOUNTER — Other Ambulatory Visit: Payer: Self-pay

## 2019-12-10 ENCOUNTER — Ambulatory Visit (INDEPENDENT_AMBULATORY_CARE_PROVIDER_SITE_OTHER)
Admission: RE | Admit: 2019-12-10 | Discharge: 2019-12-10 | Disposition: A | Discharge status: Home / Self Care | Payer: 59 | Source: Ambulatory Visit | Attending: Internal Medicine | Admitting: Internal Medicine

## 2019-12-10 ENCOUNTER — Ambulatory Visit: Payer: 59

## 2019-12-10 ENCOUNTER — Encounter: Payer: Self-pay | Admitting: Pulmonary Disease

## 2019-12-10 VITALS — BP 138/80 | HR 90 | Temp 97.3°F | Ht 70.0 in | Wt 344.8 lb

## 2019-12-10 DIAGNOSIS — R0600 Dyspnea, unspecified: Secondary | ICD-10-CM

## 2019-12-10 DIAGNOSIS — J1282 Pneumonia due to coronavirus disease 2019: Secondary | ICD-10-CM | POA: Diagnosis not present

## 2019-12-10 DIAGNOSIS — U071 COVID-19: Secondary | ICD-10-CM | POA: Diagnosis not present

## 2019-12-10 DIAGNOSIS — R0609 Other forms of dyspnea: Secondary | ICD-10-CM

## 2019-12-10 DIAGNOSIS — Z0000 Encounter for general adult medical examination without abnormal findings: Secondary | ICD-10-CM | POA: Diagnosis not present

## 2019-12-10 DIAGNOSIS — G4730 Sleep apnea, unspecified: Secondary | ICD-10-CM | POA: Diagnosis not present

## 2019-12-10 DIAGNOSIS — Z6841 Body Mass Index (BMI) 40.0 and over, adult: Secondary | ICD-10-CM

## 2019-12-10 NOTE — Assessment & Plan Note (Signed)
Plan: Continue work with primary care on ways to reduce your BMI

## 2019-12-10 NOTE — Progress Notes (Signed)
@Patient  ID: , male    DOB: Jan 12, 1966, 54 y.o.   MRN: [id number]  Chief Complaint  Patient presents with  . Follow-up    Post Covid, follow-up, chest x-ray before    Referring provider: [id number], MD  HPI:  54 year old male never smoker followed in our office for history of COVID-19  PMH: Dyslipidemia, obesity, hypertension Smoker/ Smoking History: Never smoker Maintenance: None Pt of: Dr. 44  12/10/2019  - Visit   54 year old male never smoker initially referred to our office in October/2021 for an pulmonary consult status post COVID-19 infection and hospitalization.  He establish care with Dr. November/2021.  He was encouraged to remain on his CPAP for management of obstructive sleep apnea.  Complete a 4-week follow-up with a chest x-ray.  Patient was hospitalized on 10/05/2019.  Discharged on 10/09/2019.  Positive for COVID-19.  Unvaccinated.  Was given IV Decadron and started on remdesivir.  Did require oxygen.  He was prescribed a prednisone taper upon discharge of hospitalization.  Patient presenting today as a 4-week follow-up.  He obtained a chest x-ray prior to this visit at the [facility] location.  He reports that overall he has been doing well.  He was able to return back to work for the last 3 weeks.  He occasionally has shortness of breath.  Occasionally has a cough.  Has to use his rescue inhaler about 1 time a week.  The cough is dry typically inspiratory or when he is having to talk for extended periods of time.  Patient is managed for obstructive sleep apnea by [facility].  He reports adherence to his CPAP.  Patient received his first COVID-19 vaccination yesterday on 12/09/2019.  He has not yet received the seasonal flu vaccine.  He is currently debating receiving this.  Questionaires / Pulmonary Flowsheets:   ACT:  No flowsheet data found.  MMRC: mMRC Dyspnea Scale mMRC Score  12/10/2019 1    Epworth:  No flowsheet data  found.  Tests:   10/25/2019-chest x-ray-worsening diffuse bilateral airspace disease which could be pneumonia or progression of chronic lung disease post Covid  FENO:  No results found for: NITRICOXIDE  PFT: No flowsheet data found.  WALK:  SIX MIN WALK 11/12/2019  Supplimental Oxygen during Test? (L/min) No  Tech Comments: Patient walked average pace and maintained good sats. Did say he felt winded at the end    Imaging: No results found.  Lab Results:  CBC    Component Value Date/Time   WBC 13.5 (H) 10/08/2019 0501   RBC 5.45 10/08/2019 0501   HGB 15.7 10/08/2019 0501   HCT 48.1 10/08/2019 0501   PLT 238 10/08/2019 0501   MCV 88.3 10/08/2019 0501   MCH 28.8 10/08/2019 0501   MCHC 32.6 10/08/2019 0501   RDW 14.6 10/08/2019 0501   LYMPHSABS 1.3 10/08/2019 0501   MONOABS 0.6 10/08/2019 0501   EOSABS 0.0 10/08/2019 0501   BASOSABS 0.0 10/08/2019 0501    BMET    Component Value Date/Time   NA 134 (L) 10/08/2019 0501   K 4.4 10/08/2019 0501   CL 101 10/08/2019 0501   CO2 22 10/08/2019 0501   GLUCOSE 157 (H) 10/08/2019 0501   BUN 29 (H) 10/08/2019 0501   CREATININE 1.00 10/08/2019 0501   CREATININE 0.85 02/19/2011 0912   CALCIUM 8.4 (L) 10/08/2019 0501   GFRNONAA >60 10/08/2019 0501   GFRAA >60 10/08/2019 0501    BNP    Component Value Date/Time  BNP 38.6 10/06/2019 1234    ProBNP No results found for: PROBNP  Specialty Problems      Pulmonary Problems   Sleep apnea with use of continuous positive airway pressure (CPAP)    Managed by Dr. Earl Gala with [facility]      Pneumonia due to COVID-19 virus      No Known Allergies  Immunization History  Administered Date(s) Administered  . PFIZER SARS-COV-2 Vaccination 12/09/2019  . Td 05/05/2007    Past Medical History:  Diagnosis Date  . COVID-19 10/06/2019  . Hypertension   . Morbid obesity (HCC)   . Sleep apnea     Tobacco History: Social History   Tobacco Use  Smoking Status  Never Smoker  Smokeless Tobacco Never Used   Counseling given: Not Answered   Continue to not smoke  Outpatient Encounter Medications as of 12/10/2019  Medication Sig  . Aspirin-Salicylamide-Caffeine (BC HEADACHE POWDER PO) Take 1-2 packets by mouth daily as needed (back pain).   . calcium carbonate (TUMS - DOSED IN MG ELEMENTAL CALCIUM) 500 MG chewable tablet Chew 3 tablets by mouth daily as needed for indigestion or heartburn.  Marland Kitchen lisinopril-hydrochlorothiazide (PRINZIDE,ZESTORETIC) 20-25 MG tablet Take 1 tablet by mouth daily.   . polyethylene glycol powder (GLYCOLAX) powder Take 17 g by mouth daily. Repeat every 6 to 8 hours as needed until loose stools (Patient taking differently: Take 17 g by mouth daily as needed for mild constipation. )   No facility-administered encounter medications on file as of 12/10/2019.     Review of Systems  Review of Systems  Constitutional: Negative for activity change, chills, fatigue, fever and unexpected weight change.  HENT: Negative for postnasal drip, rhinorrhea, sinus pressure, sinus pain and sore throat.   Eyes: Negative.   Respiratory: Positive for cough and shortness of breath. Negative for wheezing.   Cardiovascular: Negative for chest pain and palpitations.  Gastrointestinal: Negative for constipation, diarrhea, nausea and vomiting.  Endocrine: Negative.   Genitourinary: Negative.   Musculoskeletal: Negative.   Skin: Negative.   Neurological: Negative for dizziness and headaches.  Psychiatric/Behavioral: Negative.  Negative for dysphoric mood. The patient is not nervous/anxious.   All other systems reviewed and are negative.    Physical Exam  BP 138/80   Pulse 90   Temp (!) 97.3 F (36.3 C) (Oral)   Ht 5\' 10"  (1.778 m)   Wt (!) 344 lb 12.8 oz (156.4 kg)   SpO2 96%   BMI 49.47 kg/m   Wt Readings from Last 5 Encounters:  12/10/19 (!) 344 lb 12.8 oz (156.4 kg)  11/12/19 (!) 331 lb 6.4 oz (150.3 kg)  10/06/19 (!) 320 lb 12.3  oz (145.5 kg)  06/23/17 (!) 325 lb (147.4 kg)  03/17/16 (!) 340 lb (154.2 kg)    BMI Readings from Last 5 Encounters:  12/10/19 49.47 kg/m  11/12/19 47.55 kg/m  10/06/19 46.03 kg/m  06/23/17 46.63 kg/m  03/17/16 48.78 kg/m     Physical Exam Vitals and nursing note reviewed.  Constitutional:      General: He is not in acute distress.    Appearance: Normal appearance. He is obese.     Comments: Morbidly obese adult male  HENT:     Head: Normocephalic and atraumatic.     Right Ear: Hearing and external ear normal.     Left Ear: Hearing and external ear normal.     Nose: No mucosal edema.     Right Turbinates: Not enlarged.  Left Turbinates: Not enlarged.     Comments: Deferred due to masking requirement    Mouth/Throat:     Comments: Deferred due to masking requirement Eyes:     Pupils: Pupils are equal, round, and reactive to light.  Cardiovascular:     Rate and Rhythm: Normal rate and regular rhythm.     Pulses: Normal pulses.     Heart sounds: Normal heart sounds. No murmur heard.   Pulmonary:     Effort: Pulmonary effort is normal.     Breath sounds: Normal breath sounds. No decreased breath sounds, wheezing or rales.  Musculoskeletal:     Cervical back: Normal range of motion.     Right lower leg: No edema.     Left lower leg: No edema.  Lymphadenopathy:     Cervical: No cervical adenopathy.  Skin:    General: Skin is warm and dry.     Capillary Refill: Capillary refill takes less than 2 seconds.     Findings: No erythema or rash.  Neurological:     General: No focal deficit present.     Mental Status: He is alert and oriented to person, place, and time.     Motor: No weakness.     Coordination: Coordination normal.     Gait: Gait is intact. Gait normal.  Psychiatric:        Mood and Affect: Mood normal.        Behavior: Behavior normal. Behavior is cooperative.        Thought Content: Thought content normal.        Judgment: Judgment normal.        Assessment & Plan:   Sleep apnea with use of continuous positive airway pressure (CPAP) Plan: Continue CPAP therapy  Pneumonia due to COVID-19 virus Chest x-ray completed today has not been formally read by the radiology team.  Does not show significant clearing Patient reports low clinical improvement  Plan: We will complete another chest x-ray in about 4 weeks If opacities continue to persist will likely need to consider high-resolution CT chest imaging as well as pulmonary function testing Received second COVID-19 vaccination as scheduled Follow-up with our office in 6 weeks  OBESITY Plan: Continue work with primary care on ways to reduce your BMI  Healthcare maintenance Plan: Received second COVID-19 vaccination as scheduled We recommend that you receive her seasonal flu vaccine    Return in about 6 weeks (around 01/21/2020), or if symptoms worsen or fail to improve, for Follow up with Dr. Dalbert Mayotte, Follow up with Elisha Headland FNP-C.   Coral Ceo, NP 12/10/2019   This appointment required 32 minutes of patient care (this includes precharting, chart review, review of results, face-to-face care, etc.).

## 2019-12-10 NOTE — Assessment & Plan Note (Signed)
Plan: Continue CPAP therapy 

## 2019-12-10 NOTE — Assessment & Plan Note (Signed)
Plan: Received second COVID-19 vaccination as scheduled We recommend that you receive her seasonal flu vaccine

## 2019-12-10 NOTE — Assessment & Plan Note (Signed)
Chest x-ray completed today has not been formally read by the radiology team.  Does not show significant clearing Patient reports low clinical improvement  Plan: We will complete another chest x-ray in about 4 weeks If opacities continue to persist will likely need to consider high-resolution CT chest imaging as well as pulmonary function testing Received second COVID-19 vaccination as scheduled Follow-up with our office in 6 weeks

## 2019-12-10 NOTE — Patient Instructions (Signed)
You were seen today by Coral Ceo, NP  for:   1. Pneumonia due to COVID-19 virus  - DG Chest 2 View; Future  Obtain a chest xray in 4 weeks at [facility] location like you did today   2. Sleep apnea with use of continuous positive airway pressure (CPAP)  We recommend that you continue using your CPAP daily >>>Keep up the hard work using your device >>> Goal should be wearing this for the entire night that you are sleeping, at least 4 to 6 hours  Remember:  . Do not drive or operate heavy machinery if tired or drowsy.  . Please notify the supply company and office if you are unable to use your device regularly due to missing supplies or machine being broken.  . Work on maintaining a healthy weight and following your recommended nutrition plan  . Maintain proper daily exercise and movement  . Maintaining proper use of your device can also help improve management of other chronic illnesses such as: Blood pressure, blood sugars, and weight management.   BiPAP/ CPAP Cleaning:  >>>Clean weekly, with Dawn soap, and bottle brush.  Set up to air dry. >>> Wipe mask out daily with wet wipe or towelette   3. Class 3 severe obesity with serious comorbidity and body mass index (BMI) of 45.0 to 49.9 in adult, unspecified obesity type (HCC)  Continue to work on reducing your BMI   4. Healthcare maintenance  Great job getting you covid shot   We recommend seasonal flu vaccine in fall /2021   We recommend today:  Orders Placed This Encounter  Procedures  . DG Chest 2 View    Standing Status:   Future    Standing Expiration Date:   06/08/2020    Order Specific Question:   Reason for Exam (SYMPTOM  OR DIAGNOSIS REQUIRED)    Answer:   post covid19    Order Specific Question:   Preferred imaging location?    Answer:   [facility]    Order Specific Question:   Radiology Contrast Protocol - do NOT remove file path    Answer:    [email or web address]   Orders Placed This Encounter  Procedures  . DG Chest 2 View   No orders of the defined types were placed in this encounter.   Follow Up:    Return in about 6 weeks (around 01/21/2020), or if symptoms worsen or fail to improve, for Follow up with Dr. Dalbert Mayotte, Follow up with Elisha Headland FNP-C.   Notification of test results are managed in the following manner: If there are  any recommendations or changes to the  plan of care discussed in office today,  we will contact you and let you know what they are. If you do not hear from [place], then your results are normal and you can view them through your  MyChart account , or a letter will be sent to you. Thank you again for trusting [place] with your care  - Thank you, [facility]    It is flu season:   >>> Best ways to protect herself from the flu: Receive the yearly flu vaccine, practice good hand hygiene washing with soap and also using hand sanitizer when available, eat a nutritious meals, get adequate rest, hydrate appropriately       Please contact the office if your symptoms worsen or you have concerns that you are not improving.   Thank you for choosing [facility] for your healthcare,  and for allowing [place] to partner with you on your healthcare journey. I am thankful to be able to provide care to you today.   Elisha Headland FNP-C

## 2020-01-21 ENCOUNTER — Ambulatory Visit: Payer: 59 | Admitting: Pulmonary Disease

## 2020-02-07 ENCOUNTER — Ambulatory Visit: Payer: 59 | Admitting: Pulmonary Disease

## 2022-06-16 ENCOUNTER — Ambulatory Visit: Payer: 59 | Admitting: Podiatry

## 2022-07-16 ENCOUNTER — Ambulatory Visit (INDEPENDENT_AMBULATORY_CARE_PROVIDER_SITE_OTHER): Payer: 59

## 2022-07-16 ENCOUNTER — Ambulatory Visit: Payer: 59 | Admitting: Podiatry

## 2022-07-16 ENCOUNTER — Encounter: Payer: Self-pay | Admitting: Podiatry

## 2022-07-16 ENCOUNTER — Other Ambulatory Visit: Payer: Self-pay | Admitting: Podiatry

## 2022-07-16 DIAGNOSIS — M7662 Achilles tendinitis, left leg: Secondary | ICD-10-CM

## 2022-07-16 DIAGNOSIS — M2140 Flat foot [pes planus] (acquired), unspecified foot: Secondary | ICD-10-CM | POA: Insufficient documentation

## 2022-07-16 DIAGNOSIS — M5430 Sciatica, unspecified side: Secondary | ICD-10-CM | POA: Insufficient documentation

## 2022-07-16 DIAGNOSIS — M7661 Achilles tendinitis, right leg: Secondary | ICD-10-CM

## 2022-07-16 DIAGNOSIS — S96911A Strain of unspecified muscle and tendon at ankle and foot level, right foot, initial encounter: Secondary | ICD-10-CM | POA: Insufficient documentation

## 2022-07-16 DIAGNOSIS — M722 Plantar fascial fibromatosis: Secondary | ICD-10-CM | POA: Insufficient documentation

## 2022-07-16 DIAGNOSIS — J309 Allergic rhinitis, unspecified: Secondary | ICD-10-CM | POA: Insufficient documentation

## 2022-07-16 MED ORDER — TRIAMCINOLONE ACETONIDE 10 MG/ML IJ SUSP
10.0000 mg | Freq: Once | INTRAMUSCULAR | Status: AC
Start: 2022-07-16 — End: 2022-07-16
  Administered 2022-07-16: 10 mg

## 2022-07-18 NOTE — Progress Notes (Signed)
Subjective:   Patient ID: Jay Gonzalez, male   DOB: 57 y.o.   MRN: [id number]   HPI Patient states is developed a lot of pain on the back of the left heel and states it has been going on for around 4 months.  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  Patient does not smoke likes to be active   Review of Systems  All other systems reviewed and are negative.       Objective:  Physical Exam Vitals and nursing note reviewed.  Constitutional:      Appearance: He is well-developed.  Pulmonary:     Effort: Pulmonary effort is normal.  Musculoskeletal:        General: Normal range of motion.  Skin:    General: Skin is warm.  Neurological:     Mental Status: He is alert.     Neurovascular status intact muscle strength adequate range of motion adequate moderate obesity noted exquisite discomfort posterior lateral aspect left heel very tender when pressed central and medial appears healthy as far as the posterior heel goes.  Good digital perfusion well-oriented x 3     Assessment:  Acute Achilles tendinitis left with inflammation fluid buildup lateral side with history of orthotics which are no longer providing good support      Plan:  H&P x-rays reviewed went ahead explained injection risk patient wants injection sterile prep and injected the lateral insertion of the Achilles 3 mg Dexasone Kenalog 5 mg Xylocaine stayed away from the medial and central and dispensed air fracture walker to wear at this time to completely immobilize the area along with aggressive ice therapy.  Did discuss long-term orthotics when we get symptoms under control  X-rays indicate posterior spur no indication stress fracture arthritis

## 2022-08-05 ENCOUNTER — Encounter: Payer: Self-pay | Admitting: Podiatry

## 2022-08-05 ENCOUNTER — Ambulatory Visit: Payer: 59 | Admitting: Podiatry

## 2022-08-05 DIAGNOSIS — M79671 Pain in right foot: Secondary | ICD-10-CM | POA: Diagnosis not present

## 2022-08-05 DIAGNOSIS — M7662 Achilles tendinitis, left leg: Secondary | ICD-10-CM

## 2022-08-05 MED ORDER — TRIAMCINOLONE ACETONIDE 10 MG/ML IJ SUSP
10.0000 mg | Freq: Once | INTRAMUSCULAR | Status: AC
Start: 2022-08-05 — End: 2022-08-05
  Administered 2022-08-05: 10 mg

## 2022-08-06 NOTE — Progress Notes (Signed)
Subjective:   Patient ID: Jay Gonzalez, male   DOB: 57 y.o.   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  He understands chances for rupture I did a careful sterile injection of 2 mg Dexasone Kenalog 5 mg lidocaine and he is to wear his boot full-time

## 2022-08-31 IMAGING — DX DG CHEST 2V
2 series · 2 of 2 positions shown · non-contrast
Comparison: Radiograph 10/25/2019

CLINICAL DATA: Follow-up, post COVID 2 months ago.

EXAM:
CHEST - 2 VIEW

[chest pa]
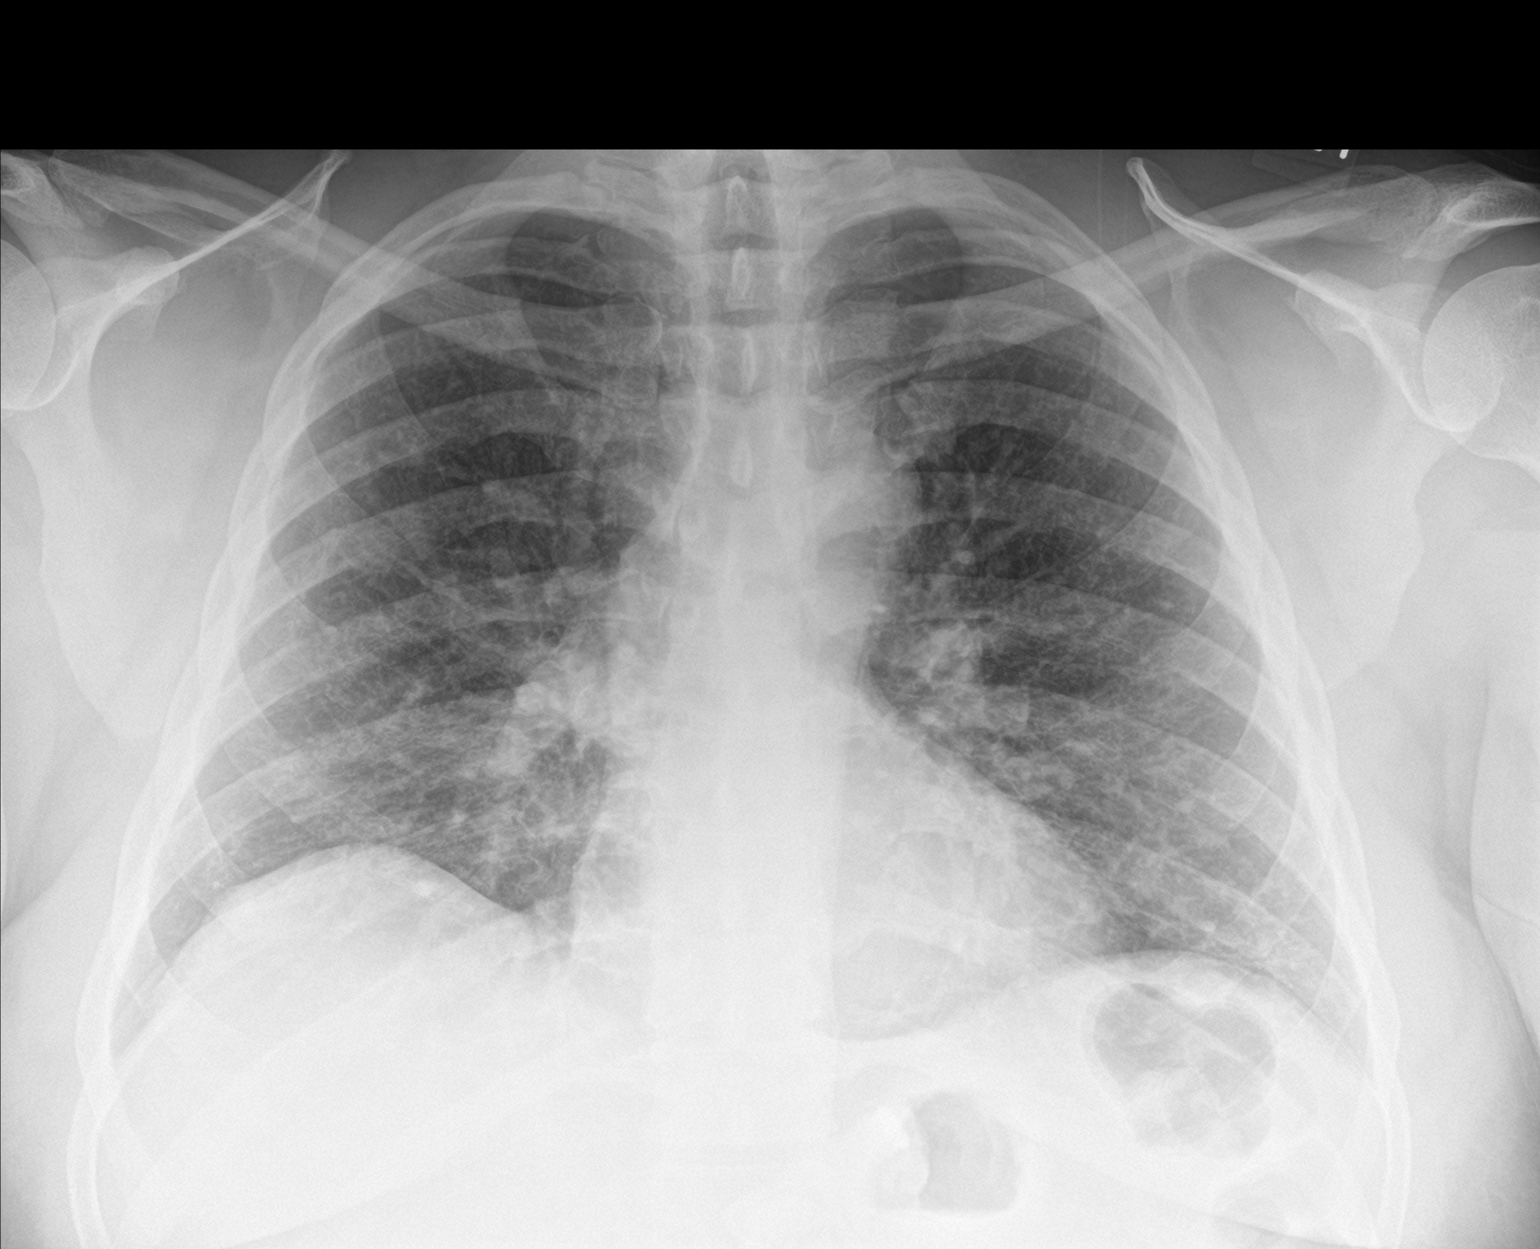

[chest lat]
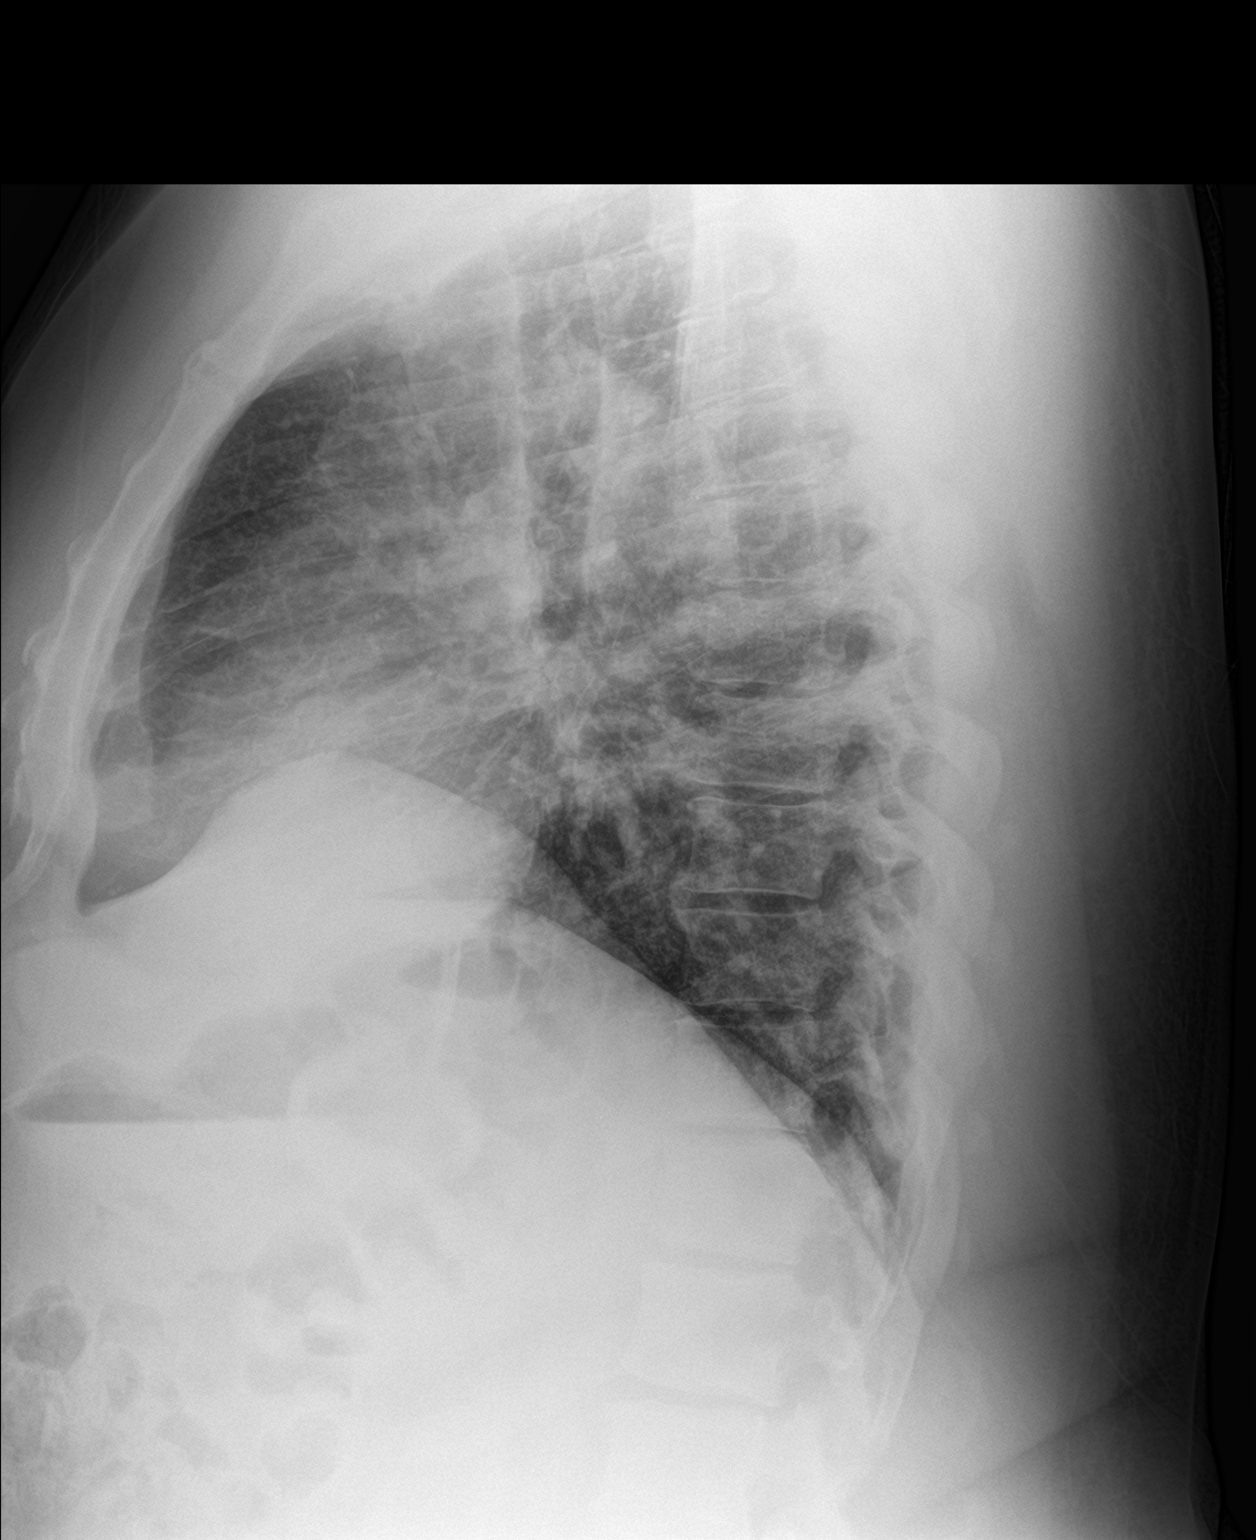

[2 of 2 positions shown; findings below may reference images not displayed]

FINDINGS: Slightly improved lung aeration. Diminished heterogeneous bilateral
pulmonary opacities with mild streaky residual in the right mid lung
and left mid lower lung zone. No new or progressive airspace
disease. Mild eventration of right hemidiaphragm. Stable heart size
and mediastinal contours. No pleural fluid or pneumothorax. No acute
osseous abnormalities are seen.
IMPRESSION: Improved lung aeration with decreased bilateral heterogeneous
pulmonary opacities. Residual streaky densities in the right mid
lung and mid left lower lung zone may represent post COVID
sequela/scarring.

## 2022-09-27 ENCOUNTER — Encounter: Payer: Self-pay | Admitting: Podiatry

## 2022-09-27 DIAGNOSIS — M79671 Pain in right foot: Secondary | ICD-10-CM | POA: Diagnosis not present

## 2022-09-27 DIAGNOSIS — M7662 Achilles tendinitis, left leg: Secondary | ICD-10-CM | POA: Diagnosis not present

## 2022-09-27 NOTE — Progress Notes (Signed)
Patient presented today to pick up orthotics.  Patient will call office back if he has any problems with them .

## 2023-08-26 ENCOUNTER — Encounter: Payer: Self-pay | Admitting: Advanced Practice Midwife
